# Patient Record
Sex: Male | Born: 1955 | Race: White | Hispanic: No | Marital: Married | Smoking: Never smoker
Health system: Southern US, Community
[De-identification: ages and names within clinical notes are randomized; demographics above are authoritative.]

## PROBLEM LIST (undated history)

## (undated) DIAGNOSIS — H9190 Unspecified hearing loss, unspecified ear: Secondary | ICD-10-CM

## (undated) DIAGNOSIS — K5790 Diverticulosis of intestine, part unspecified, without perforation or abscess without bleeding: Secondary | ICD-10-CM

## (undated) DIAGNOSIS — E785 Hyperlipidemia, unspecified: Secondary | ICD-10-CM

## (undated) DIAGNOSIS — G2581 Restless legs syndrome: Secondary | ICD-10-CM

## (undated) HISTORY — DX: Diverticulosis of intestine, part unspecified, without perforation or abscess without bleeding: K57.90

## (undated) HISTORY — PX: OTHER SURGICAL HISTORY: SHX169

## (undated) HISTORY — DX: Restless legs syndrome: G25.81

## (undated) HISTORY — PX: COLONOSCOPY: SHX174

## (undated) HISTORY — DX: Unspecified hearing loss, unspecified ear: H91.90

## (undated) HISTORY — PX: MENISCUS REPAIR: SHX5179

## (undated) HISTORY — DX: Hyperlipidemia, unspecified: E78.5

## (undated) HISTORY — PX: TONSILLECTOMY: SUR1361

---

## 1986-05-04 DIAGNOSIS — K5790 Diverticulosis of intestine, part unspecified, without perforation or abscess without bleeding: Secondary | ICD-10-CM

## 1986-05-04 HISTORY — DX: Diverticulosis of intestine, part unspecified, without perforation or abscess without bleeding: K57.90

## 1996-05-04 HISTORY — PX: PARTIAL COLECTOMY: SHX5273

## 1997-08-06 ENCOUNTER — Other Ambulatory Visit: Admission: RE | Admit: 1997-08-06 | Discharge: 1997-08-06 | Payer: Self-pay

## 2001-02-05 ENCOUNTER — Encounter: Payer: Self-pay | Admitting: Family Medicine

## 2001-02-05 ENCOUNTER — Encounter: Admission: RE | Admit: 2001-02-05 | Discharge: 2001-02-05 | Payer: Self-pay | Admitting: Family Medicine

## 2004-06-30 ENCOUNTER — Ambulatory Visit: Payer: Self-pay | Admitting: Family Medicine

## 2004-07-07 ENCOUNTER — Ambulatory Visit: Payer: Self-pay | Admitting: Family Medicine

## 2004-07-14 ENCOUNTER — Ambulatory Visit: Payer: Self-pay | Admitting: Internal Medicine

## 2004-07-28 ENCOUNTER — Ambulatory Visit: Payer: Self-pay | Admitting: Internal Medicine

## 2004-09-22 ENCOUNTER — Ambulatory Visit: Payer: Self-pay | Admitting: Family Medicine

## 2005-09-04 ENCOUNTER — Ambulatory Visit: Payer: Self-pay | Admitting: Family Medicine

## 2005-12-07 ENCOUNTER — Ambulatory Visit: Payer: Self-pay | Admitting: Family Medicine

## 2006-06-18 ENCOUNTER — Ambulatory Visit: Payer: Self-pay | Admitting: Family Medicine

## 2006-06-21 ENCOUNTER — Ambulatory Visit: Payer: Self-pay | Admitting: Family Medicine

## 2006-09-15 ENCOUNTER — Ambulatory Visit: Payer: Self-pay | Admitting: Internal Medicine

## 2006-09-23 ENCOUNTER — Ambulatory Visit: Payer: Self-pay | Admitting: Family Medicine

## 2006-09-28 ENCOUNTER — Ambulatory Visit: Payer: Self-pay | Admitting: Cardiology

## 2006-11-25 ENCOUNTER — Telehealth (INDEPENDENT_AMBULATORY_CARE_PROVIDER_SITE_OTHER): Payer: Self-pay | Admitting: *Deleted

## 2006-12-06 ENCOUNTER — Ambulatory Visit: Payer: Self-pay | Admitting: Family Medicine

## 2006-12-07 ENCOUNTER — Ambulatory Visit: Payer: Self-pay | Admitting: Family Medicine

## 2006-12-07 LAB — CONVERTED CEMR LAB
ALT: 26 units/L (ref 0–53)
AST: 21 units/L (ref 0–37)
Albumin: 4 g/dL (ref 3.5–5.2)
Alkaline Phosphatase: 40 units/L (ref 39–117)
BUN: 11 mg/dL (ref 6–23)
Basophils Absolute: 0 10*3/uL (ref 0.0–0.1)
Basophils Relative: 0.5 % (ref 0.0–1.0)
Bilirubin Urine: NEGATIVE
Bilirubin, Direct: 0.1 mg/dL (ref 0.0–0.3)
Blood in Urine, dipstick: NEGATIVE
CO2: 28 meq/L (ref 19–32)
Calcium: 9.2 mg/dL (ref 8.4–10.5)
Chloride: 108 meq/L (ref 96–112)
Cholesterol: 216 mg/dL (ref 0–200)
Creatinine, Ser: 0.9 mg/dL (ref 0.4–1.5)
Direct LDL: 132.9 mg/dL
Eosinophils Absolute: 0.1 10*3/uL (ref 0.0–0.6)
Eosinophils Relative: 2.3 % (ref 0.0–5.0)
GFR calc Af Amer: 114 mL/min
GFR calc non Af Amer: 94 mL/min
Glucose, Bld: 97 mg/dL (ref 70–99)
Glucose, Urine, Semiquant: NEGATIVE
HCT: 44.6 % (ref 39.0–52.0)
HDL: 50.2 mg/dL (ref >39.0)
Hemoglobin: 15.3 g/dL (ref 13.0–17.0)
Ketones, urine, test strip: NEGATIVE
Lymphocytes Relative: 29.3 % (ref 12.0–46.0)
MCHC: 34.3 g/dL (ref 30.0–36.0)
MCV: 95.1 fL (ref 78.0–100.0)
Monocytes Absolute: 0.4 10*3/uL (ref 0.2–0.7)
Monocytes Relative: 10.3 % (ref 3.0–11.0)
Neutro Abs: 2.3 10*3/uL (ref 1.4–7.7)
Neutrophils Relative %: 57.6 % (ref 43.0–77.0)
Nitrite: NEGATIVE
PSA: 0.47 ng/mL (ref 0.10–4.00)
Platelets: 195 10*3/uL (ref 150–400)
Potassium: 4.4 meq/L (ref 3.5–5.1)
Protein, U semiquant: NEGATIVE
RBC: 4.68 M/uL (ref 4.22–5.81)
RDW: 13.3 % (ref 11.5–14.6)
Sodium: 141 meq/L (ref 135–145)
Specific Gravity, Urine: 1.02
TSH: 1.44 microintl units/mL (ref 0.35–5.50)
Total Bilirubin: 1.4 mg/dL — ABNORMAL HIGH (ref 0.3–1.2)
Total CHOL/HDL Ratio: 4.3
Total Protein: 6.6 g/dL (ref 6.0–8.3)
Triglycerides: 113 mg/dL (ref 0–149)
Urobilinogen, UA: 0.2
VLDL: 23 mg/dL (ref 0–40)
WBC Urine, dipstick: NEGATIVE
WBC: 3.9 10*3/uL — ABNORMAL LOW (ref 4.5–10.5)
pH: 7

## 2006-12-13 ENCOUNTER — Encounter (INDEPENDENT_AMBULATORY_CARE_PROVIDER_SITE_OTHER): Payer: Self-pay | Admitting: *Deleted

## 2006-12-13 ENCOUNTER — Ambulatory Visit: Payer: Self-pay | Admitting: Family Medicine

## 2006-12-13 DIAGNOSIS — E7800 Pure hypercholesterolemia, unspecified: Secondary | ICD-10-CM

## 2006-12-13 DIAGNOSIS — J301 Allergic rhinitis due to pollen: Secondary | ICD-10-CM | POA: Insufficient documentation

## 2006-12-13 DIAGNOSIS — G2581 Restless legs syndrome: Secondary | ICD-10-CM | POA: Insufficient documentation

## 2007-06-04 IMAGING — CT CT HEAD W/O CM
2 series · 16 of 30 positions shown, 19 images · IV contrast (agent unspecified)
Comparison: None.

CLINICAL DATA: 50-year-old with dizziness, tinnitus.
 HEAD CT WITHOUT CONTRAST:
TECHNIQUE: Contiguous axial images were obtained from the base of the skull through the vertex according to standard protocol without contrast.

[Series 2: head_seq 4.5 h37s st · axial · 0.45mm/px · z∈[-128,+7]mm · 12 of 36 slices shown, 15 images]
[im 3/36  brain]
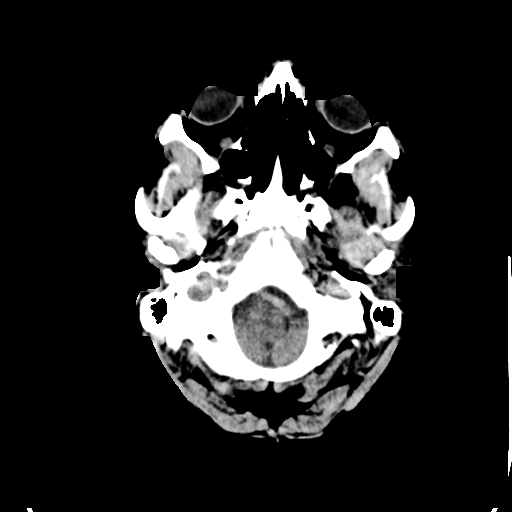
[im 3/36  bone]
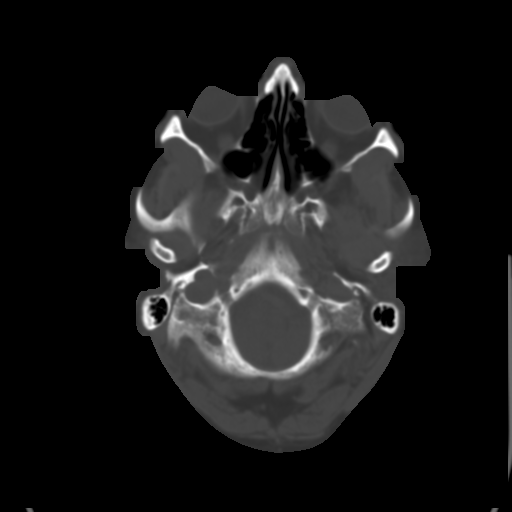
[im 6/36  brain]
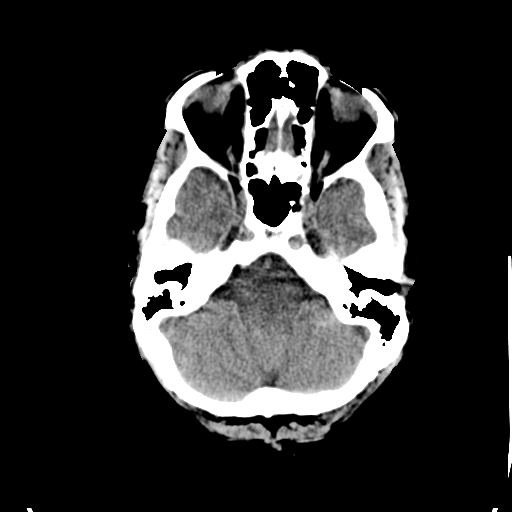
[im 9/36  brain]
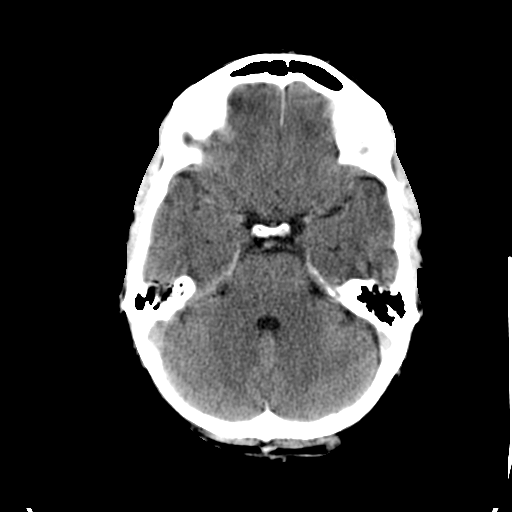
[im 11/36  brain]
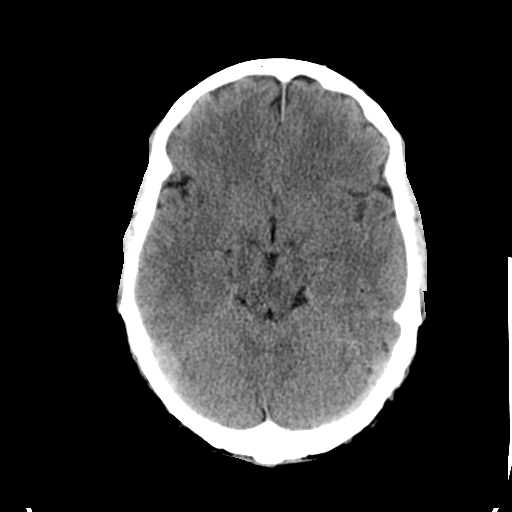
[im 14/36  brain]
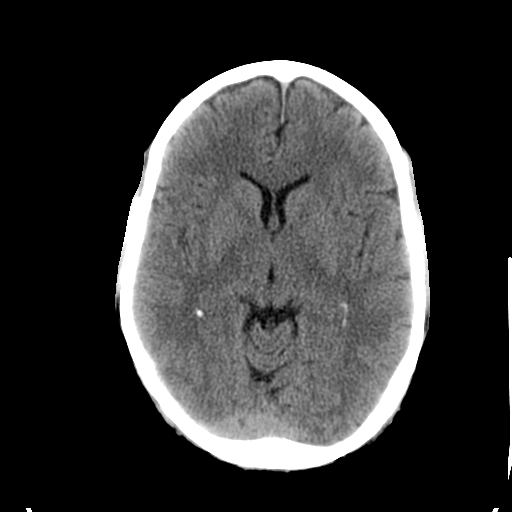
[im 14/36  bone]
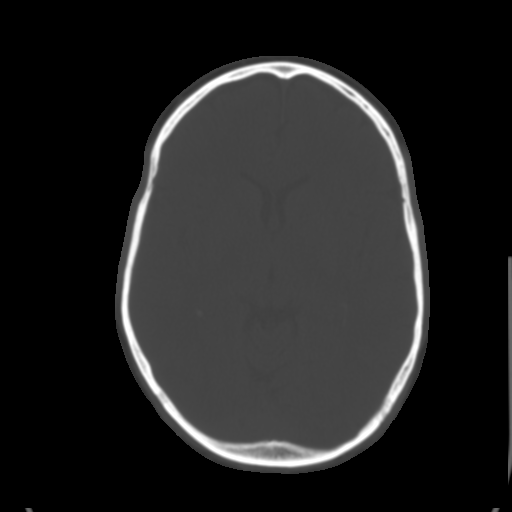
[im 17/36  brain]
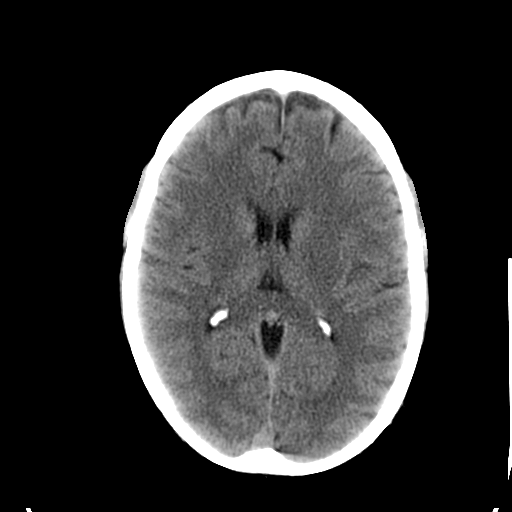
[im 19/36  brain]
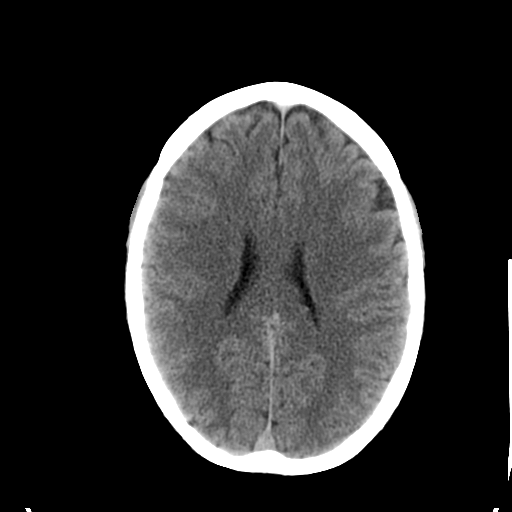
[im 22/36  brain]
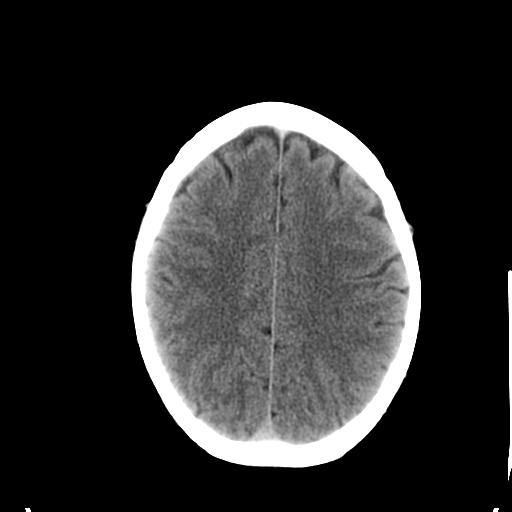
[im 25/36  brain]
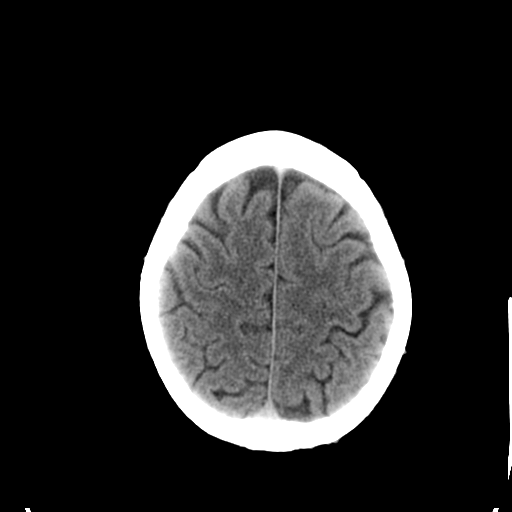
[im 25/36  bone]
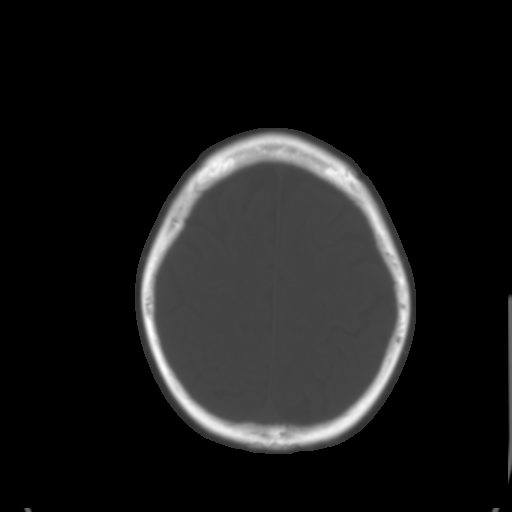
[im 27/36  brain]
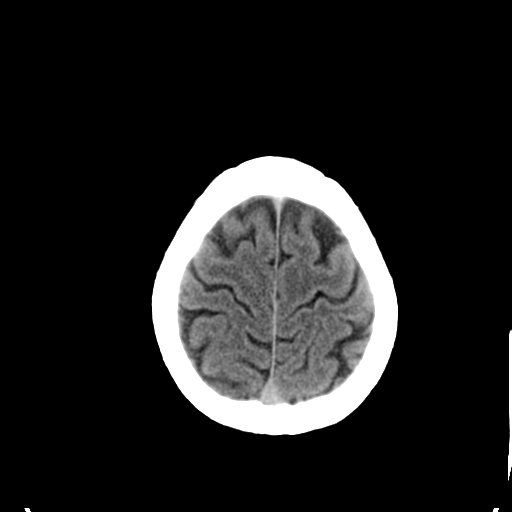
[im 30/36  brain]
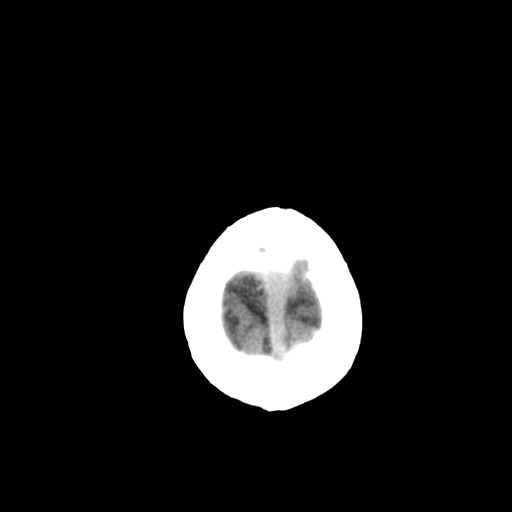
[im 33/36  brain]
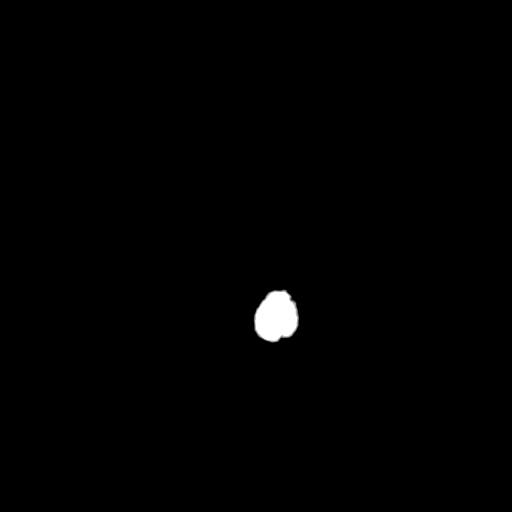

[Series 5: head_seq 1.5 h70h bone · axial · 0.30mm/px · z∈[-144,-118]mm · 4 of 42 slices shown]
[im 3/42  bone]
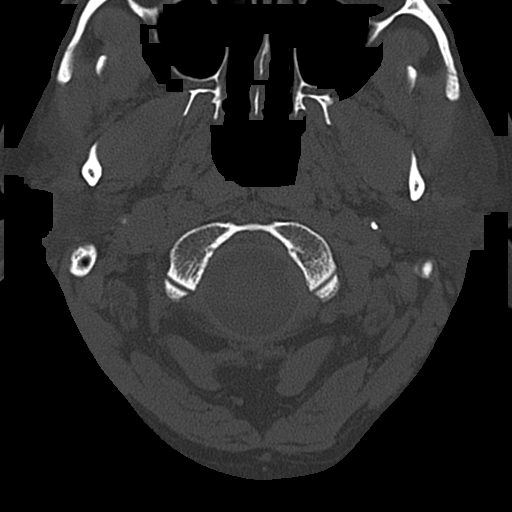
[im 9/42  bone]
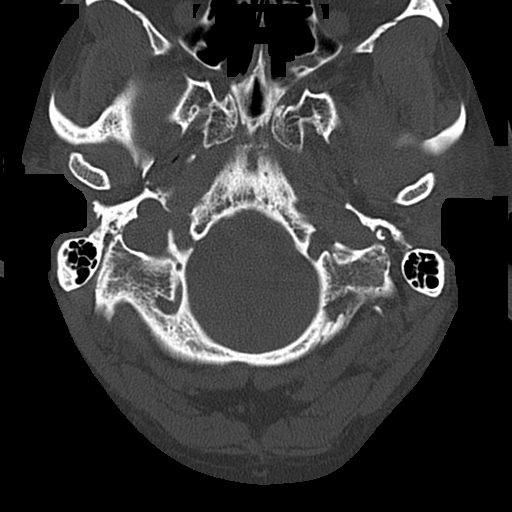
[im 14/42  bone]
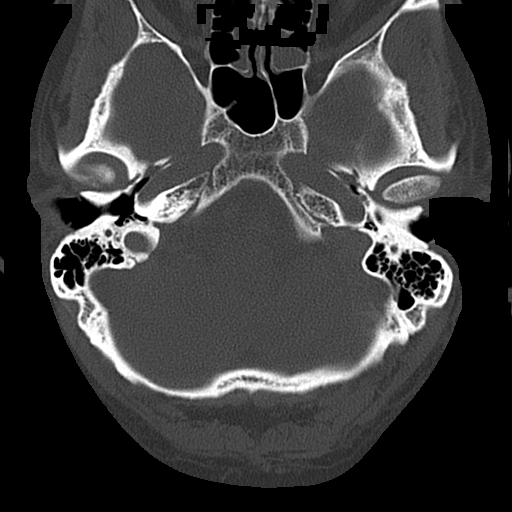
[im 20/42  bone]
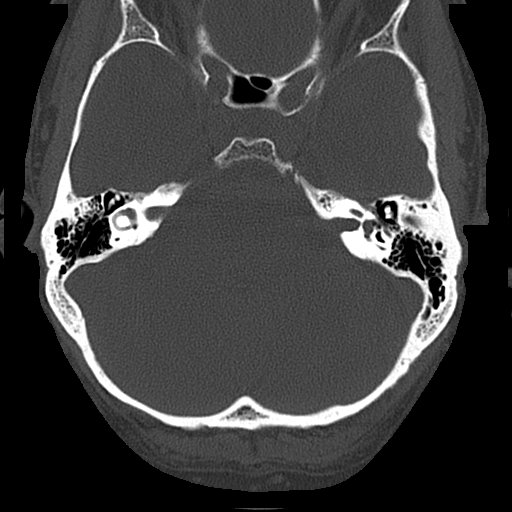

[16 of 30 positions shown; findings below may reference images not displayed]

FINDINGS: The ventricles are in the midline without mass effect or shift.  They are normal in size and configuration.  No extraaxial fluid collections are seen.  No CT evidence for acute intracranial abnormality.  No intracranial mass lesions are seen.  Brainstem and cerebellum appear normal.  The mastoid air cells and middle ear cavities are grossly normal.  Normal position and appearance of the carotid canal.  No evidence for a dehiscent jugular bulb.  The internal auditory canals appear symmetric bilaterally.  If symptoms persist, MR Zeinab be helpful in further evaluation to exclude subtle CP angle mass or acoustic schwannoma. 
 Minimal scattered ethmoid sinus disease.  The orbits appear normal.
IMPRESSION: No CT evidence for acute intracranial abnormality.  No intracranial mass lesions.  If the patient?s symptoms persist, MR Zeinab be helpful in further evaluation, as discussed above.

## 2007-06-15 ENCOUNTER — Encounter: Payer: Self-pay | Admitting: Family Medicine

## 2007-06-15 ENCOUNTER — Telehealth: Payer: Self-pay | Admitting: Family Medicine

## 2008-03-12 ENCOUNTER — Telehealth: Payer: Self-pay | Admitting: Family Medicine

## 2008-04-09 ENCOUNTER — Telehealth: Payer: Self-pay | Admitting: Family Medicine

## 2008-04-16 ENCOUNTER — Encounter: Payer: Self-pay | Admitting: Family Medicine

## 2008-04-20 ENCOUNTER — Ambulatory Visit: Payer: Self-pay | Admitting: Family Medicine

## 2008-04-20 LAB — CONVERTED CEMR LAB
ALT: 33 units/L (ref 0–53)
AST: 25 units/L (ref 0–37)
Albumin: 4.1 g/dL (ref 3.5–5.2)
Alkaline Phosphatase: 47 units/L (ref 39–117)
BUN: 13 mg/dL (ref 6–23)
Basophils Absolute: 0 10*3/uL (ref 0.0–0.1)
Basophils Relative: 0.2 % (ref 0.0–3.0)
Bilirubin Urine: NEGATIVE
Bilirubin, Direct: 0.2 mg/dL (ref 0.0–0.3)
Blood in Urine, dipstick: NEGATIVE
CO2: 30 meq/L (ref 19–32)
Calcium: 9.6 mg/dL (ref 8.4–10.5)
Chloride: 105 meq/L (ref 96–112)
Cholesterol: 231 mg/dL (ref 0–200)
Creatinine, Ser: 0.9 mg/dL (ref 0.4–1.5)
Direct LDL: 145 mg/dL
Eosinophils Absolute: 0.1 10*3/uL (ref 0.0–0.7)
Eosinophils Relative: 2.7 % (ref 0.0–5.0)
GFR calc Af Amer: 113 mL/min
GFR calc non Af Amer: 93 mL/min
Glucose, Bld: 82 mg/dL (ref 70–99)
Glucose, Urine, Semiquant: NEGATIVE
HCT: 43.7 % (ref 39.0–52.0)
HDL: 49.7 mg/dL (ref >39.0)
Hemoglobin: 15.7 g/dL (ref 13.0–17.0)
Ketones, urine, test strip: NEGATIVE
Lymphocytes Relative: 37 % (ref 12.0–46.0)
MCHC: 35.8 g/dL (ref 30.0–36.0)
MCV: 96.7 fL (ref 78.0–100.0)
Monocytes Absolute: 0.4 10*3/uL (ref 0.1–1.0)
Monocytes Relative: 10.1 % (ref 3.0–12.0)
Neutro Abs: 1.8 10*3/uL (ref 1.4–7.7)
Neutrophils Relative %: 50 % (ref 43.0–77.0)
Nitrite: NEGATIVE
PSA: 0.62 ng/mL (ref 0.10–4.00)
Platelets: 188 10*3/uL (ref 150–400)
Potassium: 3.8 meq/L (ref 3.5–5.1)
Protein, U semiquant: NEGATIVE
RBC: 4.52 M/uL (ref 4.22–5.81)
RDW: 12.5 % (ref 11.5–14.6)
Sodium: 140 meq/L (ref 135–145)
Specific Gravity, Urine: 1.015
TSH: 1.51 microintl units/mL (ref 0.35–5.50)
Total Bilirubin: 1.4 mg/dL — ABNORMAL HIGH (ref 0.3–1.2)
Total CHOL/HDL Ratio: 4.6
Total Protein: 6.7 g/dL (ref 6.0–8.3)
Triglycerides: 127 mg/dL (ref 0–149)
Urobilinogen, UA: 0.2
VLDL: 25 mg/dL (ref 0–40)
WBC Urine, dipstick: NEGATIVE
WBC: 3.7 10*3/uL — ABNORMAL LOW (ref 4.5–10.5)
pH: 7.5

## 2008-04-26 ENCOUNTER — Ambulatory Visit: Payer: Self-pay | Admitting: Family Medicine

## 2008-04-26 DIAGNOSIS — E785 Hyperlipidemia, unspecified: Secondary | ICD-10-CM

## 2008-04-26 DIAGNOSIS — R0989 Other specified symptoms and signs involving the circulatory and respiratory systems: Secondary | ICD-10-CM | POA: Insufficient documentation

## 2008-04-26 DIAGNOSIS — J309 Allergic rhinitis, unspecified: Secondary | ICD-10-CM | POA: Insufficient documentation

## 2008-04-26 DIAGNOSIS — R0609 Other forms of dyspnea: Secondary | ICD-10-CM

## 2008-05-08 ENCOUNTER — Ambulatory Visit: Payer: Self-pay | Admitting: Internal Medicine

## 2008-05-08 ENCOUNTER — Encounter: Payer: Self-pay | Admitting: Family Medicine

## 2008-10-15 ENCOUNTER — Ambulatory Visit: Payer: Self-pay | Admitting: Family Medicine

## 2008-10-15 DIAGNOSIS — H8110 Benign paroxysmal vertigo, unspecified ear: Secondary | ICD-10-CM

## 2008-10-15 DIAGNOSIS — L0203 Carbuncle of face: Secondary | ICD-10-CM

## 2008-10-15 DIAGNOSIS — L0202 Furuncle of face: Secondary | ICD-10-CM

## 2008-10-15 LAB — CONVERTED CEMR LAB
ALT: 29 units/L (ref 0–53)
AST: 22 units/L (ref 0–37)
Albumin: 4.1 g/dL (ref 3.5–5.2)
Alkaline Phosphatase: 49 units/L (ref 39–117)
Bilirubin, Direct: 0.1 mg/dL (ref 0.0–0.3)
Cholesterol: 193 mg/dL (ref 0–200)
HDL: 46.2 mg/dL (ref >39.00)
LDL Cholesterol: 117 mg/dL — ABNORMAL HIGH (ref 0–99)
Total Bilirubin: 1 mg/dL (ref 0.3–1.2)
Total CHOL/HDL Ratio: 4
Total Protein: 6.9 g/dL (ref 6.0–8.3)
Triglycerides: 147 mg/dL (ref 0.0–149.0)
VLDL: 29.4 mg/dL (ref 0.0–40.0)

## 2008-10-31 ENCOUNTER — Telehealth: Payer: Self-pay | Admitting: *Deleted

## 2009-04-22 ENCOUNTER — Ambulatory Visit: Payer: Self-pay | Admitting: Family Medicine

## 2009-04-22 LAB — CONVERTED CEMR LAB
ALT: 36 units/L (ref 0–53)
AST: 26 units/L (ref 0–37)
Albumin: 4.2 g/dL (ref 3.5–5.2)
Alkaline Phosphatase: 51 units/L (ref 39–117)
BUN: 13 mg/dL (ref 6–23)
Basophils Absolute: 0 10*3/uL (ref 0.0–0.1)
Basophils Relative: 0.4 % (ref 0.0–3.0)
Bilirubin Urine: NEGATIVE
Bilirubin, Direct: 0 mg/dL (ref 0.0–0.3)
Blood in Urine, dipstick: NEGATIVE
CO2: 30 meq/L (ref 19–32)
Calcium: 9.2 mg/dL (ref 8.4–10.5)
Chloride: 106 meq/L (ref 96–112)
Cholesterol: 202 mg/dL — ABNORMAL HIGH (ref 0–200)
Creatinine, Ser: 0.9 mg/dL (ref 0.4–1.5)
Direct LDL: 123.4 mg/dL
Eosinophils Absolute: 0.1 10*3/uL (ref 0.0–0.7)
Eosinophils Relative: 2.8 % (ref 0.0–5.0)
GFR calc non Af Amer: 92.99 mL/min (ref >60)
Glucose, Bld: 104 mg/dL — ABNORMAL HIGH (ref 70–99)
Glucose, Urine, Semiquant: NEGATIVE
HCT: 47.7 % (ref 39.0–52.0)
HDL: 43.9 mg/dL (ref >39.00)
Hemoglobin: 16.1 g/dL (ref 13.0–17.0)
Ketones, urine, test strip: NEGATIVE
Lymphocytes Relative: 33.2 % (ref 12.0–46.0)
Lymphs Abs: 1.5 10*3/uL (ref 0.7–4.0)
MCHC: 33.7 g/dL (ref 30.0–36.0)
MCV: 99.5 fL (ref 78.0–100.0)
Monocytes Absolute: 0.4 10*3/uL (ref 0.1–1.0)
Monocytes Relative: 9.9 % (ref 3.0–12.0)
Neutro Abs: 2.4 10*3/uL (ref 1.4–7.7)
Neutrophils Relative %: 53.7 % (ref 43.0–77.0)
Nitrite: NEGATIVE
PSA: 0.6 ng/mL (ref 0.10–4.00)
Platelets: 195 10*3/uL (ref 150.0–400.0)
Potassium: 4.4 meq/L (ref 3.5–5.1)
RBC: 4.8 M/uL (ref 4.22–5.81)
RDW: 12.3 % (ref 11.5–14.6)
Sodium: 143 meq/L (ref 135–145)
Specific Gravity, Urine: 1.02
TSH: 1.87 microintl units/mL (ref 0.35–5.50)
Total Bilirubin: 1 mg/dL (ref 0.3–1.2)
Total CHOL/HDL Ratio: 5
Total Protein: 7.4 g/dL (ref 6.0–8.3)
Triglycerides: 108 mg/dL (ref 0.0–149.0)
Urobilinogen, UA: 1
VLDL: 21.6 mg/dL (ref 0.0–40.0)
WBC Urine, dipstick: NEGATIVE
WBC: 4.4 10*3/uL — ABNORMAL LOW (ref 4.5–10.5)
pH: 6.5

## 2009-05-07 ENCOUNTER — Ambulatory Visit: Payer: Self-pay | Admitting: Family Medicine

## 2009-05-08 ENCOUNTER — Encounter: Payer: Self-pay | Admitting: Family Medicine

## 2009-05-09 ENCOUNTER — Telehealth: Payer: Self-pay | Admitting: Family Medicine

## 2009-05-15 ENCOUNTER — Telehealth: Payer: Self-pay | Admitting: Family Medicine

## 2010-02-10 ENCOUNTER — Telehealth: Payer: Self-pay | Admitting: Family Medicine

## 2010-04-30 ENCOUNTER — Ambulatory Visit: Payer: Self-pay | Admitting: Family Medicine

## 2010-04-30 LAB — CONVERTED CEMR LAB
ALT: 25 units/L (ref 0–53)
AST: 20 units/L (ref 0–37)
BUN: 16 mg/dL (ref 6–23)
Bilirubin Urine: NEGATIVE
Bilirubin, Direct: 0.1 mg/dL (ref 0.0–0.3)
Blood in Urine, dipstick: NEGATIVE
Cholesterol: 217 mg/dL — ABNORMAL HIGH (ref 0–200)
Creatinine, Ser: 0.9 mg/dL (ref 0.4–1.5)
Eosinophils Relative: 2.3 % (ref 0.0–5.0)
GFR calc non Af Amer: 88.11 mL/min (ref >60.00)
Glucose, Urine, Semiquant: NEGATIVE
HDL: 56.5 mg/dL (ref >39.00)
Lymphocytes Relative: 34 % (ref 12.0–46.0)
Monocytes Relative: 8.6 % (ref 3.0–12.0)
Neutrophils Relative %: 54.4 % (ref 43.0–77.0)
PSA: 0.56 ng/mL (ref 0.10–4.00)
Platelets: 206 10*3/uL (ref 150.0–400.0)
Total Bilirubin: 0.6 mg/dL (ref 0.3–1.2)
Urobilinogen, UA: 0.2
VLDL: 16.8 mg/dL (ref 0.0–40.0)
WBC Urine, dipstick: NEGATIVE
WBC: 4.5 10*3/uL (ref 4.5–10.5)
pH: 8.5

## 2010-05-12 ENCOUNTER — Encounter: Payer: Self-pay | Admitting: Family Medicine

## 2010-05-12 ENCOUNTER — Ambulatory Visit
Admission: RE | Admit: 2010-05-12 | Discharge: 2010-05-12 | Payer: Self-pay | Source: Home / Self Care | Attending: Family Medicine | Admitting: Family Medicine

## 2010-05-12 DIAGNOSIS — B078 Other viral warts: Secondary | ICD-10-CM | POA: Insufficient documentation

## 2010-06-03 NOTE — Progress Notes (Signed)
Summary: rx sent to [facility]  Phone Note Refill Request   Refills Requested: Medication #1:  NASONEX 50 MCG/ACT  SUSP PRN  Medication #2:  ZOCOR 20 MG TABS 1 tab @ bedtime  Medication #3:  REQUIP 2 MG TABS 1 tab @ bedtime  Medication #4:  VIAGRA 50 MG TABS UAD. Initial call taken by: Kern Reap CMA Duncan Dull),  May 09, 2009 2:33 PM    Prescriptions: VIAGRA 50 MG TABS (SILDENAFIL CITRATE) UAD  #6 x 11   Entered by:   Kern Reap CMA (AAMA)   Authorized by:   Roderick Pee MD   Signed by:   Kern Reap CMA (AAMA) on 05/09/2009   Method used:   Electronically to        [facility]* (mail-order)       [address] [place], [place]  [address]       Ph: [phone]       Fax: [phone]   RxID:   [id number] REQUIP 2 MG TABS (ROPINIROLE HCL) 1 tab @ bedtime  #100 x 3   Entered by:   Kern Reap CMA (AAMA)   Authorized by:   Roderick Pee MD   Signed by:   Kern Reap CMA (AAMA) on 05/09/2009   Method used:   Electronically to        [facility]* (mail-order)       [address] [place], [place]  [address]       Ph: [phone]       Fax: [phone]   RxID:   [id number] ZOCOR 20 MG TABS (SIMVASTATIN) 1 tab @ bedtime  #100 x 3   Entered by:   Kern Reap CMA (AAMA)   Authorized by:   Roderick Pee MD   Signed by:   Kern Reap CMA (AAMA) on 05/09/2009   Method used:   Electronically to        [facility]* (mail-order)       [address] [place], [place]  [address]       Ph: [phone]       Fax: [phone]   RxID:   [id number] NASONEX 50 MCG/ACT  SUSP (MOMETASONE FUROATE) PRN  #3 x 3   Entered by:   Kern Reap CMA (AAMA)   Authorized by:   Roderick Pee MD   Signed by:   Kern Reap CMA (AAMA) on 05/09/2009   Method used:   Electronically to        [facility]* (mail-order)       [address] [place], [place]  [address]       Ph:  [phone]       Fax: [phone]   RxID:   [id number]

## 2010-06-03 NOTE — Progress Notes (Signed)
Summary: Refill Request  Phone Note Refill Request Message from:  Patient on February 10, 2010 9:06 AM  Refills Requested: Medication #1:  REQUIP 2 MG TABS 1 tab @ bedtime   Dosage confirmed as above?Dosage Confirmed   Brand Name Necessary? No   Supply Requested: 3 months   [facility] [address]  Next Appointment Scheduled: none Initial call taken by: Harold Barban,  February 10, 2010 9:06 AM  Follow-up for Phone Call        requip 2 mg, dispense 100 directions one nightly for RLS refills x 2 Follow-up by: Roderick Pee MD,  February 10, 2010 10:25 AM    Prescriptions: REQUIP 2 MG TABS (ROPINIROLE HCL) 1 tab @ bedtime  #100 x 3   Entered by:   Lynann Beaver CMA   Authorized by:   Roderick Pee MD   Signed by:   Lynann Beaver CMA on 02/10/2010   Method used:   [facility]  [place] [address] [facility]       [address]       [place], [place]  [address]       Ph: [phone] or [phone]       Fax: [phone]   RxID:   [id number]

## 2010-06-03 NOTE — Letter (Signed)
Summary: Health Examination Report  Health Examination Report   Imported By: Maryln Gottron 05/14/2009 12:40:03  _____________________________________________________________________  External Attachment:    Type:   Image     Comment:   External Document

## 2010-06-03 NOTE — Assessment & Plan Note (Signed)
Summary: cpx/cjr   Vital Signs:  Patient profile:   55 year old male Height:      68 inches Weight:      193 pounds BMI:     29.45 O2 Sat:      96 % on Room air Temp:     98.5 degrees F oral Pulse rate:   90 / minute BP sitting:   136 / 70  (left arm)  Vitals Entered By: Lucious Groves (May 07, 2009 9:45 AM)  O2 Flow:  Room air  History of Present Illness: Casey Kim is a 55 year old, married male, nonsmoker, who comes in today for evaluation of a few issues.  He has restless leg syndrome.  His son.  Her quick to 1 mg daily, but is not working. we will increase the dose.  He has a history of hyperlipidemia.  He takes Zocor 20 mg daily.  Lipids are at goal with an LDL cholesterol of 123, HDL 44.  He would like his skin checked.  He has a lesion on the left side of his face and one and the front of his right ear in the hairline.  In addition, he has slight skin and light eyes.  He is continued to be troubled with the tinnitus.  He seen an ENT doctor.  Evaluation was negative.  The next step is  a MRI to rule out a tumor.  He will pursue this with ENT.  He has a bump on his penis.  He would like treated.  He gets routine eye care and dental care.  Colonoscopy done in GI.  Three years ago.  He had a partial colectomy because of severe diverticulitis.  Tetanus booster 2005 seasonal flu shot today.  He and his wife now are both empty nesrwes   He states his sex life is outstanding                 Current Medications (verified): 1)  Requip 1 Mg  Tabs (Ropinirole Hcl) .Marland Kitchen.. 1 Tab At Bedtime 2)  Asa 81 3)  Nasonex 50 Mcg/act  Susp (Mometasone Furoate) .... Prn 4)  Zocor 20 Mg Tabs (Simvastatin) .Marland Kitchen.. 1 Tab @ Bedtime  Allergies (verified): 1)  ! * Fish Oil  Past History:  Past medical, surgical, family and social histories (including risk factors) reviewed, and no changes noted (except as noted below).  Past Medical History: Reviewed history from 04/26/2008 and no  changes required. Allergic rhinitis Hyperlipidemia restless leg syndrome  Past Surgical History: partial colectomy 2007 for severe diverticulitis  Family History: Reviewed history from 10/15/2008 and no changes required. Family History High cholesterol  Social History: Reviewed history from 04/26/2008 and no changes required. Occupation: Married Never Smoked Alcohol use-no Drug use-no Regular exercise-yes  Review of Systems      See HPI  Physical Exam  General:  Well-developed,well-nourished,in no acute distress; alert,appropriate and cooperative throughout examination Head:  Normocephalic and atraumatic without obvious abnormalities. No apparent alopecia or balding. Eyes:  No corneal or conjunctival inflammation noted. EOMI. Perrla. Funduscopic exam benign, without hemorrhages, exudates or papilledema. Vision grossly normal. Ears:  External ear exam shows no significant lesions or deformities.  Otoscopic examination reveals clear canals, tympanic membranes are intact bilaterally without bulging, retraction, inflammation or discharge. Hearing is grossly normal bilaterally. Nose:  External nasal examination shows no deformity or inflammation. Nasal mucosa are pink and moist without lesions or exudates. Mouth:  Oral mucosa and oropharynx without lesions or exudates.  Teeth in good  repair. Neck:  No deformities, masses, or tenderness noted. Chest Wall:  No deformities, masses, tenderness or gynecomastia noted. Breasts:  No masses or gynecomastia noted Lungs:  Normal respiratory effort, chest expands symmetrically. Lungs are clear to auscultation, no crackles or wheezes. Heart:  Normal rate and regular rhythm. S1 and S2 normal without gallop, murmur, click, rub or other extra sounds. Abdomen:  Bowel sounds positive,abdomen soft and non-tender without masses, organomegaly or hernias noted. Rectal:  No external abnormalities noted. Normal sphincter tone. No rectal masses or  tenderness. Genitalia:  Testes bilaterally descended without nodularity, tenderness or masses. No scrotal masses or lesions. No penis lesions or urethral discharge. Prostate:  Prostate gland firm and smooth, no enlargement, nodularity, tenderness, mass, asymmetry or induration. Msk:  No deformity or scoliosis noted of thoracic or lumbar spine.   Pulses:  R and L carotid,radial,femoral,dorsalis pedis and posterior tibial pulses are full and equal bilaterally Extremities:  No clubbing, cyanosis, edema, or deformity noted with normal full range of motion of all joints.   Neurologic:  No cranial nerve deficits noted. Station and gait are normal. Plantar reflexes are down-going bilaterally. DTRs are symmetrical throughout. Sensory, motor and coordinative functions appear intact. Skin:  total body skin exam normal.  A light red lesion on his left for head that was a bump that he scraped.  Also, the lesion in his right hairline is a mole.  The margins are smooth, etc. Cervical Nodes:  No lymphadenopathy noted Axillary Nodes:  No palpable lymphadenopathy Inguinal Nodes:  No significant adenopathy Psych:  Cognition and judgment appear intact. Alert and cooperative with normal attention span and concentration. No apparent delusions, illusions, hallucinations   Impression & Recommendations:  Problem # 1:  HYPERLIPIDEMIA (ICD-272.4) Assessment Improved  His updated medication list for this problem includes:    Zocor 20 Mg Tabs (Simvastatin) .Marland Kitchen... 1 tab @ bedtime  Problem # 2:  RESTLESS LEG SYNDROME (ICD-333.94) Assessment: Deteriorated  Problem # 3:  PREVENTIVE HEALTH CARE (ICD-V70.0) Assessment: Unchanged  Orders: EKG w/ Interpretation ([address])  Complete Medication List: 1)  Asa 81  2)  Nasonex 50 Mcg/act Susp (Mometasone furoate) .... Prn 3)  Zocor 20 Mg Tabs (Simvastatin) .Marland Kitchen.. 1 tab @ bedtime 4)  Requip 2 Mg Tabs (Ropinirole hcl) .Marland Kitchen.. 1 tab @ bedtime 5)  Viagra 50 Mg Tabs (Sildenafil  citrate) .... Uad  Other Orders: Admin 1st Vaccine ([address]) Flu Vaccine 64yrs + ([address])  Patient Instructions: 1)  Please schedule a follow-up appointment in 1 year. 2)  It is important that you exercise regularly at least 20 minutes 5 times a week. If you develop chest pain, have severe difficulty breathing, or feel very tired , stop exercising immediately and seek medical attention. 3)  Schedule a colonoscopy/sigmoidoscopy to help detect colon cancer. 4)  Take an Aspirin every day. Prescriptions: VIAGRA 50 MG TABS (SILDENAFIL CITRATE) UAD  #6 x 11   Entered and Authorized by:   Roderick Pee MD   Signed by:   Roderick Pee MD on 05/07/2009   Method used:   [facility]  [place] [address] [facility]       [address]       [place], [place]  [address]       Ph: [phone] or [phone]       Fax: [phone]   RxID:   [id number] ZOCOR 20 MG TABS (SIMVASTATIN) 1 tab @ bedtime  #100 x  3   Entered and Authorized by:   Roderick Pee MD   Signed by:   Roderick Pee MD on 05/07/2009   Method used:   [facility]        [facility]  [place] [address] [facility]       [address]       [place], [place]  [address]       Ph: [phone] or [phone]       Fax: [phone]   RxID:   [id number] NASONEX 50 MCG/ACT  SUSP (MOMETASONE FUROATE) PRN  #3 x 3   Entered and Authorized by:   Roderick Pee MD   Signed by:   Roderick Pee MD on 05/07/2009   Method used:   [facility]        [facility]  [place] [address]       [address]       [place], [place]  [address]       Ph: [phone] or [phone]       Fax: [phone]   RxID:   [id number] REQUIP 2 MG TABS (ROPINIROLE HCL) 1 tab @ bedtime  #100 x 3   Entered and Authorized by:   Roderick Pee MD   Signed by:   Roderick Pee MD on 05/07/2009   Method used:   [facility]        [facility]  [place] [address] [facility]       [address]       [place], [place]  [address]       Ph:  [phone] or [phone]       Fax: [phone]   RxID:   [id number]   Preventive Care Screening  Colonoscopy:    Date:  05/04/2005    Results:  abnormal       Flu Vaccine Consent Questions     Do you have a history of severe allergic reactions to this vaccine? no    Any prior history of allergic reactions to egg and/or gelatin? no    Do you have a sensitivity to the preservative Thimersol? no    Do you have a past history of Guillan-Barre Syndrome? no    Do you currently have an acute febrile illness? no    Have you ever had a severe reaction to latex? no    Vaccine information given and explained to patient? yes    Are you currently pregnant? no    Lot Number:[id number]   Exp Date:10/31/2009   Site Given  Left Deltoid IMbflu Lucious Groves, CMA

## 2010-06-03 NOTE — Progress Notes (Signed)
Summary: form completed?  Phone Note Call from Patient Call back at cell [phone]   Caller: Patient, via vm Call For: Roderick Pee MD Summary of Call: Left form last week.  Needs it now ASAP.  Please call when ready. Initial call taken by: Gladis Riffle, RN,  May 15, 2009 12:42 PM  Follow-up for Phone Call        I copied the forearm and had the patient called to come pickup form Follow-up by: Roderick Pee MD,  May 15, 2009 2:04 PM

## 2010-06-05 NOTE — Assessment & Plan Note (Signed)
Summary: cpx/cjr   Vital Signs:  Patient profile:   55 year old male Height:      68 inches Weight:      188 pounds BMI:     28.69 Temp:     98.2 degrees F oral BP sitting:   130 / 90  (left arm) Cuff size:   regular  Vitals Entered By: Kern Reap CMA Duncan Dull) (May 12, 2010 11:35 AM) CC: cpx Is Patient Diabetic? No   CC:  cpx.  History of Present Illness: Casey Kim is a 55 year old, married male, nonsmoker, who comes in today for general physical examination because of allergic rhinitis, hyperlipidemia, restless leg syndrome, mild ED, and a wart on his penis in his rectum he would like treated.  Also in the past two, years he's noticed a slight tremor is worse in his left thumb.  We discussed the tremor and the RLS and he would like a consult from a neurologist.  I recommend Dr. Porfirio Mylar D..  Review of systems otherwise negative.  He gets routine eye care, dental care, colonoscopy 2007 normal, tetanus, 2005, seasonal flu shot 2011.  Allergies: 1)  ! * Fish Oil  Past History:  Past medical, surgical, family and social histories (including risk factors) reviewed, and no changes noted (except as noted below).  Past Medical History: Reviewed history from 04/26/2008 and no changes required. Allergic rhinitis Hyperlipidemia restless leg syndrome  Past Surgical History: Reviewed history from 05/07/2009 and no changes required. partial colectomy 2007 for severe diverticulitis  Family History: Reviewed history from 10/15/2008 and no changes required. Family History High cholesterol  Social History: Reviewed history from 04/26/2008 and no changes required. Occupation: Married Never Smoked Alcohol use-no Drug use-no Regular exercise-yes  Review of Systems      See HPI  Physical Exam  General:  Well-developed,well-nourished,in no acute distress; alert,appropriate and cooperative throughout examination Head:  Normocephalic and atraumatic without obvious  abnormalities. No apparent alopecia or balding. Eyes:  No corneal or conjunctival inflammation noted. EOMI. Perrla. Funduscopic exam benign, without hemorrhages, exudates or papilledema. Vision grossly normal. Ears:  External ear exam shows no significant lesions or deformities.  Otoscopic examination reveals clear canals, tympanic membranes are intact bilaterally without bulging, retraction, inflammation or discharge. Hearing is grossly normal bilaterally. Nose:  External nasal examination shows no deformity or inflammation. Nasal mucosa are pink and moist without lesions or exudates. Mouth:  Oral mucosa and oropharynx without lesions or exudates.  Teeth in good repair. Neck:  No deformities, masses, or tenderness noted. Chest Wall:  No deformities, masses, tenderness or gynecomastia noted. Breasts:  No masses or gynecomastia noted Lungs:  Normal respiratory effort, chest expands symmetrically. Lungs are clear to auscultation, no crackles or wheezes. Heart:  Normal rate and regular rhythm. S1 and S2 normal without gallop, murmur, click, rub or other extra sounds. Abdomen:  Bowel sounds positive,abdomen soft and non-tender without masses, organomegaly or hernias noted. Rectal:  No external abnormalities noted. Normal sphincter tone. No rectal masses or tenderness. Genitalia:  Testes bilaterally descended without nodularity, tenderness or masses. No scrotal masses or lesions. No penis lesions or urethral discharge. Prostate:  Prostate gland firm and smooth, no enlargement, nodularity, tenderness, mass, asymmetry or induration. Msk:  No deformity or scoliosis noted of thoracic or lumbar spine.   Pulses:  R and L carotid,radial,femoral,dorsalis pedis and posterior tibial pulses are full and equal bilaterally Extremities:  No clubbing, cyanosis, edema, or deformity noted with normal full range of motion of all joints.  Neurologic:  No cranial nerve deficits noted. Station and gait are normal. Plantar  reflexes are down-going bilaterally. DTRs are symmetrical throughout. 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  That was removed by the dermatologist.  Also, a wart on the scrotum and on the rectum were treated with 80% TCA Cervical Nodes:  No lymphadenopathy noted Axillary Nodes:  No palpable lymphadenopathy Inguinal Nodes:  No significant adenopathy Psych:  Cognition and judgment appear intact. Alert and cooperative with normal attention span and concentration. No apparent delusions, illusions, hallucinations   Impression & Recommendations:  Problem # 1:  HYPERLIPIDEMIA (ICD-272.4) Assessment Improved  His updated medication list for this problem includes:    Zocor 20 Mg Tabs (Simvastatin) .Marland Kitchen... 1 tab @ bedtime  Orders: Prescription Created Electronically [phone])  Problem # 2:  ALLERGIC RHINITIS (ICD-477.9) Assessment: Improved  His updated medication list for this problem includes:    Nasonex 50 Mcg/act Susp (Mometasone furoate) .Marland Kitchen... Prn  Orders: Prescription Created Electronically [phone])  Problem # 3:  RESTLESS LEG SYNDROME (ICD-333.94) Assessment: Deteriorated  Orders: Prescription Created Electronically [phone])  Problem # 4:  PREVENTIVE HEALTH CARE (ICD-V70.0) Assessment: Unchanged  Orders: Prescription Created Electronically [phone])  Complete Medication List: 1)  Asa 81  2)  Nasonex 50 Mcg/act Susp (Mometasone furoate) .... Prn 3)  Zocor 20 Mg Tabs (Simvastatin) .Marland Kitchen.. 1 tab @ bedtime 4)  Requip 2 Mg Tabs (Ropinirole hcl) .Marland Kitchen.. 1 tab @ bedtime 5)  Viagra 50 Mg Tabs (Sildenafil citrate) .... Uad  Other Orders: Cryotherapy/Destruction benign or premalignant lesion (2nd-14th lesions) ([address])  Patient Instructions: 1)  if you need any jewelry for her daughter's wedding.  Call [facility] [phone] 2)  we  will call  [facility] and request a consult from Dr. Porfirio Mylar D.//////////// neurologist for evaluation of the tremor and the restless leg syndrome 3)  Please schedule a follow-up appointment in 1 year. 4)  Please schedule a follow-up appointment as needed. Prescriptions: VIAGRA 50 MG TABS (SILDENAFIL CITRATE) UAD  #6 x 11   Entered and Authorized by:   Roderick Pee MD   Signed by:   Roderick Pee MD on 05/12/2010   Method used:   [facility]        [facility]  [place] [address] [facility]       [address]       [place], [place]  [address]       Ph: [phone] or [phone]       Fax: [phone]   RxID:   [id number] REQUIP 2 MG TABS (ROPINIROLE HCL) 1 tab @ bedtime  #100 x 3   Entered and Authorized by:   Roderick Pee MD   Signed by:   Roderick Pee MD on 05/12/2010   Method used:   [facility]        [facility]  [place] [address] [facility]       [address]       [place], [place]  [address]       Ph: [phone] or [phone]       Fax: [phone]   RxID:   [id number] ZOCOR 20 MG TABS (SIMVASTATIN) 1 tab @ bedtime  #100 x 3   Entered and Authorized by:   Roderick Pee MD   Signed by:  Roderick Pee MD on 05/12/2010   Method used:   [facility]        [facility]  [place] [address] [facility]       [address]       [place], [place]  [address]       Ph: [phone] or [phone]       Fax: [phone]   RxID:   [id number] NASONEX 50 MCG/ACT  SUSP (MOMETASONE FUROATE) PRN  #3 x 3   Entered and Authorized by:   Roderick Pee MD   Signed by:   Roderick Pee MD on 05/12/2010   Method used:   [facility]        [facility]  [place] [address]       [address]       [place], [place]  [address]       Ph: [phone] or [phone]       Fax: [phone]   RxID:   [id number]    Orders Added: 1)  Prescription Created Electronically [G8553] 2)  Est. Patient 40-64 years [[address]] 3)  Cryotherapy/Destruction benign or premalignant  lesion (2nd-14th lesions) [[address]]       Appended Document: Orders Update     Clinical Lists Changes  Orders: Added new Service order of EKG w/ Interpretation ([address]) - Signed

## 2010-06-30 ENCOUNTER — Telehealth: Payer: Self-pay | Admitting: Family Medicine

## 2010-06-30 DIAGNOSIS — G2581 Restless legs syndrome: Secondary | ICD-10-CM

## 2010-06-30 MED ORDER — ROPINIROLE HCL ER 2 MG PO TB24: 2.0000 mg | ORAL_TABLET | Freq: Every day | ORAL | Status: DC

## 2010-06-30 MED ORDER — MOMETASONE FUROATE 50 MCG/ACT NA SUSP: 2.0000 | Freq: Every day | NASAL | Status: AC

## 2010-06-30 NOTE — Telephone Encounter (Signed)
Pt called to check on stauts of refill on meds:  Rapineral, 10mg   And  Nasonex..... [facility] [place].

## 2010-08-26 ENCOUNTER — Telehealth: Payer: Self-pay | Admitting: *Deleted

## 2010-08-26 MED ORDER — ROPINIROLE HCL 2 MG PO TABS: 2.0000 mg | ORAL_TABLET | Freq: Every day | ORAL | Status: DC

## 2010-08-26 NOTE — Telephone Encounter (Signed)
yes

## 2010-08-26 NOTE — Telephone Encounter (Signed)
There is no generic Requip xl is it okay to switch back to regular Requip?

## 2010-08-26 NOTE — Telephone Encounter (Signed)
Pt would like to speak with Fleet Contras in regards to switching from Requip XL to regular Requip.

## 2010-10-27 ENCOUNTER — Other Ambulatory Visit: Payer: Self-pay | Admitting: Dermatology

## 2011-02-09 ENCOUNTER — Encounter: Payer: Self-pay | Admitting: Family Medicine

## 2011-02-09 ENCOUNTER — Ambulatory Visit (INDEPENDENT_AMBULATORY_CARE_PROVIDER_SITE_OTHER): Payer: 59 | Admitting: Family Medicine

## 2011-02-09 VITALS — BP 120/86 | Temp 98.4°F | Wt 195.0 lb

## 2011-02-09 DIAGNOSIS — H019 Unspecified inflammation of eyelid: Secondary | ICD-10-CM

## 2011-02-09 DIAGNOSIS — H00019 Hordeolum externum unspecified eye, unspecified eyelid: Secondary | ICD-10-CM

## 2011-02-09 MED ORDER — CEPHALEXIN 500 MG PO CAPS: ORAL_CAPSULE | ORAL | Status: AC

## 2011-02-09 NOTE — Progress Notes (Signed)
  Subjective:    Patient ID: Casey Kim, male    DOB: 1956-03-26, 55 y.o.   MRN: [id number]  HPI Casey Kim is a 55 year old, married male, nonsmoker........... Now employed again, after 11 F. Months of unemployment........ Comes in today for evaluation of an infection.  The lower left eyelid x 5 days.  He states today the redness and swelling has diminished, but not gone.  Vision normal.  No history of trauma   Review of Systems General an ophthalmologic review of systems otherwise negative   Objective:   Physical Exam  Well-developed well-nourished, male in no acute distress.  Examination of the right eye shows the upper lid to be normal.  There is some inflammation, redness of the lower lashes.  No palpable mass or abscess      Assessment & Plan:  Infection of the lower left eyelashes, left eye.  Plan warm soaks Keflex 1 g b.i.d. Consult with the ophthalmologist p.r.n.

## 2011-02-09 NOTE — Patient Instructions (Signed)
Warm soaks 3 times daily.  Keflex two tabs twice daily.  If the infection does not resolve, then I would recommend you see Dr. Mia Creek, ophthalmologist

## 2011-06-02 ENCOUNTER — Other Ambulatory Visit: Payer: Self-pay | Admitting: *Deleted

## 2011-06-02 MED ORDER — SIMVASTATIN 20 MG PO TABS: 20.0000 mg | ORAL_TABLET | Freq: Every day | ORAL | Status: DC

## 2011-08-24 ENCOUNTER — Other Ambulatory Visit: Payer: Self-pay | Admitting: Family Medicine

## 2011-08-24 NOTE — Telephone Encounter (Signed)
Pt is going out of town Wednesday. Please call medications in to pharm today

## 2011-09-08 ENCOUNTER — Other Ambulatory Visit (INDEPENDENT_AMBULATORY_CARE_PROVIDER_SITE_OTHER): Payer: 59

## 2011-09-08 DIAGNOSIS — Z0000 Encounter for general adult medical examination without abnormal findings: Secondary | ICD-10-CM

## 2011-09-08 LAB — POCT URINALYSIS DIPSTICK
Glucose, UA: NEGATIVE
Ketones, UA: NEGATIVE
Leukocytes, UA: NEGATIVE
Protein, UA: NEGATIVE
Spec Grav, UA: 1.015
Urobilinogen, UA: 0.2

## 2011-09-08 LAB — CBC WITH DIFFERENTIAL/PLATELET
Basophils Absolute: 0 10*3/uL (ref 0.0–0.1)
Eosinophils Absolute: 0.1 10*3/uL (ref 0.0–0.7)
Lymphocytes Relative: 33.5 % (ref 12.0–46.0)
MCHC: 33.7 g/dL (ref 30.0–36.0)
Monocytes Relative: 10.4 % (ref 3.0–12.0)
Platelets: 194 10*3/uL (ref 150.0–400.0)
RDW: 13.8 % (ref 11.5–14.6)

## 2011-09-08 LAB — BASIC METABOLIC PANEL
BUN: 14 mg/dL (ref 6–23)
CO2: 27 mEq/L (ref 19–32)
Calcium: 9.2 mg/dL (ref 8.4–10.5)
Creatinine, Ser: 0.9 mg/dL (ref 0.4–1.5)
GFR: 89.89 mL/min (ref >60.00)
Glucose, Bld: 97 mg/dL (ref 70–99)

## 2011-09-08 LAB — LIPID PANEL
Cholesterol: 185 mg/dL (ref 0–200)
HDL: 43.3 mg/dL
LDL Cholesterol: 118 mg/dL — ABNORMAL HIGH (ref 0–99)
Total CHOL/HDL Ratio: 4
Triglycerides: 118 mg/dL (ref 0.0–149.0)
VLDL: 23.6 mg/dL (ref 0.0–40.0)

## 2011-09-08 LAB — TSH: TSH: 1.44 u[IU]/mL (ref 0.35–5.50)

## 2011-09-08 LAB — HEPATIC FUNCTION PANEL
AST: 24 U/L (ref 0–37)
Albumin: 3.9 g/dL (ref 3.5–5.2)
Alkaline Phosphatase: 44 U/L (ref 39–117)
Total Bilirubin: 0.8 mg/dL (ref 0.3–1.2)

## 2011-09-22 ENCOUNTER — Encounter: Payer: Self-pay | Admitting: Family Medicine

## 2011-09-22 ENCOUNTER — Ambulatory Visit (INDEPENDENT_AMBULATORY_CARE_PROVIDER_SITE_OTHER): Payer: 59 | Admitting: Family Medicine

## 2011-09-22 DIAGNOSIS — J301 Allergic rhinitis due to pollen: Secondary | ICD-10-CM

## 2011-09-22 DIAGNOSIS — N529 Male erectile dysfunction, unspecified: Secondary | ICD-10-CM | POA: Insufficient documentation

## 2011-09-22 DIAGNOSIS — G2581 Restless legs syndrome: Secondary | ICD-10-CM

## 2011-09-22 DIAGNOSIS — E785 Hyperlipidemia, unspecified: Secondary | ICD-10-CM

## 2011-09-22 DIAGNOSIS — Z0000 Encounter for general adult medical examination without abnormal findings: Secondary | ICD-10-CM

## 2011-09-22 MED ORDER — SIMVASTATIN 20 MG PO TABS: 20.0000 mg | ORAL_TABLET | Freq: Every day | ORAL | Status: DC

## 2011-09-22 MED ORDER — ROPINIROLE HCL 2 MG PO TABS: 2.0000 mg | ORAL_TABLET | Freq: Every day | ORAL | Status: DC

## 2011-09-22 MED ORDER — SILDENAFIL CITRATE 100 MG PO TABS: 50.0000 mg | ORAL_TABLET | Freq: Every day | ORAL | Status: DC | PRN

## 2011-09-22 NOTE — Patient Instructions (Signed)
Continue your current medications  We will increase the Viagra to 100 mg,,,,,,,,,,,, which you can actually cut in 1/4  Return in one year sooner if any problems

## 2011-09-22 NOTE — Progress Notes (Signed)
  Subjective:    Patient ID: Casey Kim, male    DOB: 1956-04-05, 56 y.o.   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   HENT: Negative.   Eyes: Negative.   Respiratory: Negative.   Cardiovascular: Negative.   Gastrointestinal: Negative.   Genitourinary: Negative.   Musculoskeletal: Negative.   Skin: Negative.   Neurological: Negative.   Hematological: Negative.   Psychiatric/Behavioral: Negative.        Objective:   Physical Exam  Constitutional: He is oriented to person, place, and time. He appears well-developed and well-nourished.  HENT:  Head: Normocephalic and atraumatic.  Right Ear: External ear normal.  Left Ear: External ear normal.  Nose: Nose normal.  Mouth/Throat: Oropharynx is clear and moist.  Eyes: Conjunctivae and EOM are normal. Pupils are equal, round, and reactive to light.  Neck: Normal range of motion. Neck supple. No JVD present. No tracheal deviation present. No thyromegaly present.  Cardiovascular: Normal rate, regular rhythm, normal heart sounds and intact distal pulses.  Exam reveals no gallop and no friction rub.   No murmur heard. Pulmonary/Chest: Effort normal and breath sounds normal. No stridor. No respiratory distress. He has no wheezes. He has no rales. He exhibits no tenderness.  Abdominal: Soft. Bowel sounds are normal. He exhibits no distension and no mass. There is no tenderness. There is no rebound  and no guarding.  Genitourinary: Rectum normal, prostate normal and penis normal. Guaiac negative stool. No penile tenderness.       1 wart treated with 80% TCA  Musculoskeletal: Normal range of motion. He exhibits no edema and no tenderness.  Lymphadenopathy:    He has no cervical adenopathy.  Neurological: He is alert and oriented to person, place, and time. He has normal reflexes. No cranial nerve deficit. He exhibits normal muscle tone.  Skin: Skin is warm and dry. No rash noted. No erythema. No pallor.  Psychiatric: He has a normal mood and affect. His behavior is normal. Judgment and thought content normal.          Assessment & Plan:  Healthy male  Restless leg syndrome continue Requip 2 mg daily  Hyperlipidemia continue Zocor 20 and an aspirin tablet  Erectile dysfunction continue Viagra  Low back pain recommend daily stretching exercises

## 2012-04-06 ENCOUNTER — Telehealth: Payer: Self-pay | Admitting: Family Medicine

## 2012-04-06 DIAGNOSIS — H9190 Unspecified hearing loss, unspecified ear: Secondary | ICD-10-CM

## 2012-04-06 DIAGNOSIS — H9319 Tinnitus, unspecified ear: Secondary | ICD-10-CM

## 2012-04-06 NOTE — Telephone Encounter (Signed)
Pt would like to have a referral to Dr Jonny Ruiz May [phone] at [facility] for ringing in ears/hearing loss. Pt has [employer] ins and Dr May office would not allow him to make his own appt.

## 2012-04-18 ENCOUNTER — Ambulatory Visit (INDEPENDENT_AMBULATORY_CARE_PROVIDER_SITE_OTHER): Payer: 59 | Admitting: Family Medicine

## 2012-04-18 ENCOUNTER — Encounter: Payer: Self-pay | Admitting: Family Medicine

## 2012-04-18 VITALS — BP 120/88 | Temp 98.0°F | Wt 198.0 lb

## 2012-04-18 DIAGNOSIS — K645 Perianal venous thrombosis: Secondary | ICD-10-CM

## 2012-04-18 NOTE — Progress Notes (Signed)
  Subjective:    Patient ID: Casey Kim, male    DOB: 1955-06-28, 56 y.o.   MRN: [id number]  HPI Casey Kim is a 56 year old male who comes in today for evaluation of 2 thrombosed hemorrhoids  He said over the weekend he noticed some pain swelling and bleeding. One of the hemorrhoids as drain the other one is still swollen and not draining. No history of trauma constipation etc.   Review of Systems GI review of systems otherwise negative bowel habits normal    Objective:   Physical Exam  Well-developed well-nourished male in no acute distress examination rectum shows a thrombosed hemorrhoid at 12:00 position with a clot this is a 1 has been bleeding also 8 marble size thrombosed hemorrhoid at the 9:00 position partially in the anal canal that is not bleeding      Assessment & Plan:  Hemorrhoids x2 plan outlined program return when necessary

## 2012-04-18 NOTE — Patient Instructions (Signed)
Warm soaks twice daily, stool softener, apply the medicated cream twice daily  Return when necessary

## 2012-05-27 ENCOUNTER — Other Ambulatory Visit: Payer: Self-pay | Admitting: Family Medicine

## 2012-05-27 DIAGNOSIS — G2581 Restless legs syndrome: Secondary | ICD-10-CM

## 2012-06-10 ENCOUNTER — Ambulatory Visit (INDEPENDENT_AMBULATORY_CARE_PROVIDER_SITE_OTHER): Payer: 59 | Admitting: Neurology

## 2012-06-10 ENCOUNTER — Encounter: Payer: Self-pay | Admitting: Neurology

## 2012-06-10 VITALS — BP 117/70 | HR 76 | Temp 98.2°F | Resp 12 | Ht 69.0 in | Wt 197.0 lb

## 2012-06-10 DIAGNOSIS — G2581 Restless legs syndrome: Secondary | ICD-10-CM

## 2012-06-10 DIAGNOSIS — R251 Tremor, unspecified: Secondary | ICD-10-CM

## 2012-06-10 DIAGNOSIS — R259 Unspecified abnormal involuntary movements: Secondary | ICD-10-CM

## 2012-06-10 MED ORDER — CLONAZEPAM 0.5 MG PO TABS: 0.5000 mg | ORAL_TABLET | Freq: Every evening | ORAL | Status: DC | PRN

## 2012-06-10 MED ORDER — ROPINIROLE HCL 3 MG PO TABS: 3.0000 mg | ORAL_TABLET | Freq: Every day | ORAL | Status: DC

## 2012-06-10 NOTE — Progress Notes (Addendum)
Casey Kim is a 57 year old male with a history of restless legs syndrome. This appears to be a primary, inherited form of restless leg syndrome. His mother, who is still living in an pretty good health, has restless leg syndrome also. However, his brothers do not have it. His father did not have it. Neither parent had a significant tremor that he knows of. His father died in his 76s.  Casey Kim began with the symptoms in childhood. He had the symptoms in the daytime including at the movies and especially at night when the symptoms were most prominent. His wife has noticed that he doesn't have medicine he will bicycle and kick his legs all night. He is currently on Requip which she's been taking for the last several years without side effects. He has increased the dosage to 2 mg and he estimates that its effect of about 80% of the time. He also has to take it for a clock or a doesn't seem to work. Also if he takes it for 7 he sometimes gets sleepy and drowsy.  It is at a social event and has a couple drinks he prefers not to take the Requip because he gets home late and it doesn't seem to work as well anyway. He wonders if there something else he can take in these occasional instances that might be effective.  He applied for long-term care insurance and there was mention of a mild tremor that cause him concern in his application was denied. He is now referred for further evaluation of the tremor. This has been present for about 2 years he notices it a little bit more on the left than the right. Occasionally diffuse eating soup he will notice the tremor as he brings the food up to his mouth, but it doesn't stop her from doing any of his normal activities. He can fish and high hopes onto the line without a problem. He is also occasionally present at rest but just. He has no changes in his gait. He has no difficulty getting around. There is no micrographia. There is no change in his facial expressions. He was up full  and active lifestyle and feels that he is pretty healthy.  Review of symptoms is positive for hemorrhoids and ED. The remainder of review of symptoms is negative  Past Medical History  Diagnosis Date  . Allergic rhinitis   . Hyperlipidemia   . Restless leg syndrome   . Diverticulitis   . Hearing loss     Current Outpatient Prescriptions on File Prior to Visit  Medication Sig Dispense Refill  . aspirin 81 MG tablet Take 81 mg by mouth daily.        Marland Kitchen omeprazole (PRILOSEC) 10 MG capsule Take 10 mg by mouth daily.      Marland Kitchen rOPINIRole (REQUIP) 2 MG tablet Take 1 tablet (2 mg total) by mouth at bedtime.  90 tablet  3  . simvastatin (ZOCOR) 20 MG tablet Take 1 tablet (20 mg total) by mouth at bedtime.  100 tablet  3  . clonazePAM (KLONOPIN) 0.5 MG tablet Take 1 tablet (0.5 mg total) by mouth at bedtime as needed for anxiety.  15 tablet  1   Fish oil allergy  History   Social History  . Marital Status: Married    Spouse Name: N/A    Number of Children: N/A  . Years of Education: N/A   Occupational History  . Not on file.   Social History Main Topics  . Smoking  status: Never Smoker   . Smokeless tobacco: Former Neurosurgeon  . Alcohol Use: Yes     Comment: 2-3 beers a day  . Drug Use: Yes     Comment: marijuana weekly  . Sexually Active: Not on file   Other Topics Concern  . Not on file   Social History Narrative  . No narrative on file   No family history on file.  He notes family istory of rls in his mother.  BP 117/70  Pulse 76  Temp 98.2 F (36.8 C)  Resp 12  Ht 5\' 9"  (1.753 m)  Wt 197 lb (89.359 kg)  BMI 29.09 kg/m2   Alert and oriented x 3.  Memory function appears to be intact.  Concentration and attention are normal for educational level and background.  Speech is fluent and without significant word finding difficulty.  Is aware of current events.  No carotid bruits detected.  Cranial nerve II through XII are within normal limits.  This includes normal optic discs and  acuity, EOMI, PERLA, facial movement and sensation intact, hearing grossly intact, gag intact,Uvula raises symmetrically and tongue protrudes evenly. Motor strength is 5 over 5 throughout all limbs.  No atrophy, abnormal tone including no cogwheeling.  There is a very slight tremor of the left thumb noted in some positions including with his hand on his lap. With the Archimedes spiral there is some evidence of this tremor on the left only. There is no micrographia. With intention with an extension of a 12 and stick, he has a slight intention tremor more on the left when reaching towards a stationary object such as my finger. Reflexes are 2+ and symmetric in the upper and lower extremities toes downgoing Sensory exam is intact. Coordination is intact for fine movements and rapid alternating movements in all limbs Gait and station are normal.   Impression: 1. Primary restless leg syndrome with onset in childhood and a familiar pattern of inheritance from his mother. The current medication of Requip is well-tolerated and it is 80% effective in his estimation. There are also some situations where he is not able to take the Requip in time to be effective because of the window in which Requip works for him.  2. 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  Plan: 1.  He can try Requip 3 mg to see if this is also well tolerated and affective and high percentage of nights.  The daytime symptoms ever tend to increase we can discuss other dosing schedules. 2. If he is not able to take the Requip within the affected window, he can try an occasional clonazepam 0.5 mg which is typically very effective for restless leg syndrome.  As we are prescribing the medications, I would like to see him  In 6 months to see if they are effective and if they need any adjustment.  3. As far as the tremor, this is not appear to be a Parkinson's type of tremor. I see no evidence of  cogwheeling or other symptoms to suggest any indication of Parkinson's. As the restless legs syndrome isn't the primary an inherited type, it is not associated with significantly increased risk of Parkinson's in the future.  We can take a look at the tremor again in 12 months to see if there's been any significant change.

## 2012-09-15 ENCOUNTER — Other Ambulatory Visit: Payer: 59

## 2012-09-22 ENCOUNTER — Encounter: Payer: 59 | Admitting: Family Medicine

## 2012-11-07 ENCOUNTER — Other Ambulatory Visit (INDEPENDENT_AMBULATORY_CARE_PROVIDER_SITE_OTHER): Payer: 59

## 2012-11-07 DIAGNOSIS — Z0000 Encounter for general adult medical examination without abnormal findings: Secondary | ICD-10-CM

## 2012-11-07 LAB — CBC WITH DIFFERENTIAL/PLATELET
Basophils Absolute: 0 10*3/uL (ref 0.0–0.1)
Basophils Relative: 0.7 % (ref 0.0–3.0)
Hemoglobin: 16.1 g/dL (ref 13.0–17.0)
Lymphocytes Relative: 35.9 % (ref 12.0–46.0)
Monocytes Relative: 10.8 % (ref 3.0–12.0)
Neutro Abs: 2.1 10*3/uL (ref 1.4–7.7)
Neutrophils Relative %: 48.2 % (ref 43.0–77.0)
RBC: 4.62 Mil/uL (ref 4.22–5.81)

## 2012-11-07 LAB — POCT URINALYSIS DIPSTICK
Blood, UA: NEGATIVE
Glucose, UA: NEGATIVE
Ketones, UA: NEGATIVE
Spec Grav, UA: 1.02
Urobilinogen, UA: 1

## 2012-11-07 LAB — BASIC METABOLIC PANEL
BUN: 14 mg/dL (ref 6–23)
CO2: 30 mEq/L (ref 19–32)
Chloride: 105 mEq/L (ref 96–112)
Creatinine, Ser: 0.9 mg/dL (ref 0.4–1.5)

## 2012-11-07 LAB — HEPATIC FUNCTION PANEL
AST: 17 U/L (ref 0–37)
Albumin: 4.1 g/dL (ref 3.5–5.2)
Alkaline Phosphatase: 47 U/L (ref 39–117)
Bilirubin, Direct: 0.1 mg/dL (ref 0.0–0.3)
Total Protein: 6.7 g/dL (ref 6.0–8.3)

## 2012-11-07 LAB — PSA: PSA: 0.57 ng/mL (ref 0.10–4.00)

## 2012-11-07 LAB — LIPID PANEL
Cholesterol: 232 mg/dL — ABNORMAL HIGH (ref 0–200)
Total CHOL/HDL Ratio: 5
Triglycerides: 193 mg/dL — ABNORMAL HIGH (ref 0.0–149.0)
VLDL: 38.6 mg/dL (ref 0.0–40.0)

## 2012-11-14 ENCOUNTER — Ambulatory Visit (INDEPENDENT_AMBULATORY_CARE_PROVIDER_SITE_OTHER): Payer: 59 | Admitting: Family Medicine

## 2012-11-14 ENCOUNTER — Encounter: Payer: Self-pay | Admitting: Family Medicine

## 2012-11-14 VITALS — BP 120/84 | Temp 98.8°F | Ht 68.0 in | Wt 195.0 lb

## 2012-11-14 DIAGNOSIS — N529 Male erectile dysfunction, unspecified: Secondary | ICD-10-CM

## 2012-11-14 DIAGNOSIS — E785 Hyperlipidemia, unspecified: Secondary | ICD-10-CM

## 2012-11-14 DIAGNOSIS — K645 Perianal venous thrombosis: Secondary | ICD-10-CM

## 2012-11-14 DIAGNOSIS — G2581 Restless legs syndrome: Secondary | ICD-10-CM

## 2012-11-14 MED ORDER — CLONAZEPAM 0.5 MG PO TABS: 0.5000 mg | ORAL_TABLET | Freq: Every evening | ORAL | Status: DC | PRN

## 2012-11-14 MED ORDER — SIMVASTATIN 20 MG PO TABS: 20.0000 mg | ORAL_TABLET | Freq: Every day | ORAL | Status: DC

## 2012-11-14 MED ORDER — ROPINIROLE HCL 3 MG PO TABS: 3.0000 mg | ORAL_TABLET | Freq: Every day | ORAL | Status: DC

## 2012-11-14 NOTE — Progress Notes (Signed)
  Subjective:    Patient ID: ABIMAEL ZEITER, male    DOB: 09-22-1955, 57 y.o.   MRN: [id number]  HPI  Raji is a 57 year old married male nonsmoker who comes in today for general physical examination  For hyperlipidemia he takes Zocor and aspirin  He takes Klonopin 0.5 each bedtime along with 3 mg of workup for restless leg syndrome. We get initial evaluation and he subsequently referred him to neurology. They do not have much else to offer.  He gets routine eye care, dental work, colonoscopy and GI, tetanus 2005 booster due in the year  Review of Systems  Constitutional: Negative.   HENT: Negative.   Eyes: Negative.   Respiratory: Negative.   Cardiovascular: Negative.   Gastrointestinal: Negative.   Genitourinary: Negative.   Musculoskeletal: Negative.   Skin: Negative.   Neurological: Negative.   Psychiatric/Behavioral: Negative.        Objective:   Physical Exam  Nursing note and vitals reviewed. Constitutional: He is oriented to person, place, and time. He appears well-developed and well-nourished.  HENT:  Head: Normocephalic and atraumatic.  Right Ear: External ear normal.  Left Ear: External ear normal.  Nose: Nose normal.  Mouth/Throat: Oropharynx is clear and moist.  Eyes: Conjunctivae and EOM are normal. Pupils are equal, round, and reactive to light.  Neck: Normal range of motion. Neck supple. No JVD present. No tracheal deviation present. No thyromegaly present.  Cardiovascular: Normal rate, regular rhythm, normal heart sounds and intact distal pulses.  Exam reveals no gallop and no friction rub.   No murmur heard. Pulmonary/Chest: Effort normal and breath sounds normal. No stridor. No respiratory distress. He has no wheezes. He has no rales. He exhibits no tenderness.  Abdominal: Soft. Bowel sounds are normal. He exhibits no distension and no mass. There is no tenderness. There is no rebound and no guarding.  Genitourinary: Rectum normal, prostate normal and  penis normal. Guaiac negative stool. No penile tenderness.  2 is his penis this were treated with KOH  Musculoskeletal: Normal range of motion. He exhibits no edema and no tenderness.  Lymphadenopathy:    He has no cervical adenopathy.  Neurological: He is alert and oriented to person, place, and time. He has normal reflexes. No cranial nerve deficit. He exhibits normal muscle tone.  Skin: Skin is warm and dry. No rash noted. No erythema. No pallor.  Psychiatric: He has a normal mood and affect. His behavior is normal. Judgment and thought content normal.          Assessment & Plan:  Healthy male  History is restless leg syndrome continue Klonopin and Requip each bedtime consider stopping the Zocor  Hyperlipidemia and stopped the Zocor because of the restless leg syndrome for couple months and see if that doesn't help  2 warts on his penis painted with KOH

## 2012-11-14 NOTE — Patient Instructions (Signed)
Continue the Requip 3 mg at bedtime and Klonopin when necessary  Hold the Zocor for 2 months to see if has any effect on your leg pain  Call me in 2 months leave me a voicemail to call you back and we'll discuss how you feel

## 2013-11-20 ENCOUNTER — Encounter: Payer: Self-pay | Admitting: Family Medicine

## 2013-11-20 ENCOUNTER — Ambulatory Visit (INDEPENDENT_AMBULATORY_CARE_PROVIDER_SITE_OTHER): Payer: 59 | Admitting: Family Medicine

## 2013-11-20 VITALS — BP 130/90 | HR 68 | Temp 98.1°F | Wt 196.0 lb

## 2013-11-20 DIAGNOSIS — K625 Hemorrhage of anus and rectum: Secondary | ICD-10-CM | POA: Insufficient documentation

## 2013-11-20 LAB — CBC
HCT: 45.8 % (ref 39.0–52.0)
Hemoglobin: 15.6 g/dL (ref 13.0–17.0)
MCHC: 34.1 g/dL (ref 30.0–36.0)
MCV: 100 fl (ref 78.0–100.0)
Platelets: 210 10*3/uL (ref 150.0–400.0)
RBC: 4.58 Mil/uL (ref 4.22–5.81)
RDW: 13.4 % (ref 11.5–15.5)
WBC: 4.1 10*3/uL (ref 4.0–10.5)

## 2013-11-20 LAB — POC HEMOCCULT BLD/STL (OFFICE/1-CARD/DIAGNOSTIC): Fecal Occult Blood, POC: POSITIVE

## 2013-11-20 NOTE — Progress Notes (Signed)
Garret Reddish, MD Phone: [phone]  Subjective:   Casey Kim is a 58 y.o. year old very pleasant male patient who presents with the following:  Rectal Bleeding Patient had a normal bowel movement yesterday but then looked in the toilet and noted bright red blood throughout the toilet bowel. He has a history of a hemorrhoid tag but had not had any issues recently. He states he had a small amount of blood one month ago and believes his stools have been much darker since that time (? Melena). Patient admits to worsening fatigue over last month as well. Not currently taking aspirin for at least 1 month. He has not seen surgery who performed operation in a # of years. His last colonoscopy was in 2007. He had a normal bowel movement today without blood. It was not dark either.   ROS- no chest pain or shortness of breath. Fatigue did not acutely worsen since last night.  Past Medical/surgical History- history of partial colectomy due to diverticulitis before 2000, erectile dysfunction, HLD, restless legs, seasonal allergies  Medications- reviewed and updated Current Outpatient Prescriptions  Medication Sig Dispense Refill  . clonazePAM (KLONOPIN) 0.5 MG tablet Take 1 tablet (0.5 mg total) by mouth at bedtime as needed for anxiety.  60 tablet  3  . omeprazole (PRILOSEC) 10 MG capsule Take 10 mg by mouth daily.      Marland Kitchen rOPINIRole (REQUIP) 3 MG tablet Take 1 tablet (3 mg total) by mouth at bedtime.  100 tablet  3  . simvastatin (ZOCOR) 20 MG tablet Take 1 tablet (20 mg total) by mouth at bedtime.  100 tablet  3   Objective: BP 130/90  Pulse 68  Temp(Src) 98.1 F (36.7 C) (Oral)  Wt 196 lb (88.905 kg) Gen: NAD, resting comfortably on table HEENT: no mucus membrane pallor CV: RRR no murmurs rubs or gallops Lungs: CTAB no crackles, wheeze, rhonchi Abdomen: soft/nontender/nondistended/normal bowel sounds. No rebound or guarding.  Ext: no edema Skin: warm, dry Rectal: hemorrhoid tag at 6  o clock. No active bleeding. Prostate normal. FOBT+ but not grossly bloody.   Results for orders placed in visit on 11/20/13 (from the past 24 hour(s))  CBC     Status: None   Collection Time    11/20/13 10:37 AM      Result Value Ref Range   WBC 4.1  4.0 - 10.5 K/uL   RBC 4.58  4.22 - 5.81 Mil/uL   Platelets 210.0  150.0 - 400.0 K/uL   Hemoglobin 15.6  13.0 - 17.0 g/dL   HCT 45.8  39.0 - 52.0 %   MCV 100.0  78.0 - 100.0 fl   MCHC 34.1  30.0 - 36.0 g/dL   RDW 13.4  11.5 - 15.5 %  POC HEMOCCULT BLD/STL (OFFICE/1-CARD/DIAGNOSTIC)     Status: Abnormal   Collection Time    11/20/13 10:51 AM      Result Value Ref Range   Card #1 Date 11/20/13     Fecal Occult Blood, POC Positive      Assessment/Plan:  Rectal bleeding BRBPR yesterday and FOBT + today in patient with history of partial colectomy. CBC today without anemia. Have referred patient to GI at this time for repeat colonoscopy and consideration of endoscopy given ? Melena. Bleed certainly could be from remaining diverticula, hopeful not from anastomosis site but colonoscopy will give further information.   Platelets also normal.   Orders Placed This Encounter  Procedures  . CBC  [place]  . Ambulatory referral to Gastroenterology    Referral Priority:  Routine    Referral Type:  Consultation    Referral Reason:  Specialty Services Required    Requested Specialty:  Gastroenterology    Number of Visits Requested:  1  . POC Hemoccult Bld/Stl (1-Cd Office Dx)

## 2013-11-20 NOTE — Assessment & Plan Note (Signed)
BRBPR yesterday and FOBT + today in patient with history of partial colectomy. CBC today without anemia. Have referred patient to GI at this time for repeat colonoscopy and consideration of endoscopy given ? Melena. Bleed certainly could be from remaining diverticula, hopeful not from anastomosis site but colonoscopy will give further information.

## 2013-11-20 NOTE — Progress Notes (Signed)
Pre visit review using our clinic review tool, if applicable. No additional management support is needed unless otherwise documented below in the visit note. 

## 2013-11-20 NOTE — Patient Instructions (Signed)
Rectal Bleeding  Check blood counts  Send you to stomach doctor (GI) for colonoscopy, consideration of endoscopy  Reasons for return: worsening fatigue, recurrent bleeds, bleeds that do not stop, chest pain or shortness of breath

## 2013-11-21 ENCOUNTER — Ambulatory Visit (INDEPENDENT_AMBULATORY_CARE_PROVIDER_SITE_OTHER): Payer: 59 | Admitting: Internal Medicine

## 2013-11-21 ENCOUNTER — Encounter: Payer: Self-pay | Admitting: Internal Medicine

## 2013-11-21 VITALS — BP 110/80 | HR 80 | Ht 68.0 in | Wt 196.4 lb

## 2013-11-21 DIAGNOSIS — K625 Hemorrhage of anus and rectum: Secondary | ICD-10-CM

## 2013-11-21 MED ORDER — BENEFIBER PO POWD: ORAL | Status: DC

## 2013-11-21 NOTE — Patient Instructions (Signed)
You have been scheduled for a colonoscopy. Please follow written instructions given to you at your visit today.  Please pick up your prep supplies at the pharmacy. If you use inhalers (even only as needed), please bring them with you on the day of your procedure. Your physician has requested that you go to www.startemmi.com and enter the access code given to you at your visit today. This web site gives a general overview about your procedure. However, you should still follow specific instructions given to you by our office regarding your preparation for the procedure.  Today you have been given a hemorrhoid handout.  Today you have been given a handout on benefiber to read and follow.  I appreciate the opportunity to care for you.

## 2013-11-21 NOTE — Progress Notes (Signed)
   Subjective:    Patient ID: Casey Kim, male    DOB: March 21, 1956, 58 y.o.   MRN: [id number]  HPI Patient is here with his wife, he is known to me from prior colonoscopy about 8 years ago that was negative. He's had some intermittent minor rectal bleeding off and on but more recently, 2-3 days ago he had a larger amount of blood which concerned him. He was seen by primary care. Hemoglobin was normal. He reports having a swollen painful hemorrhoid about 7 months ago after a long car trip to [place]. Denies abdominal pain. He does have infrequent defecation at times. Tends towards constipation and strains and sit on the toilet for long periods of time, and reads.  Medications, allergies, past medical history, past surgical history, family history and social history are reviewed and updated in the EMR.  Review of Systems As above    Objective:   Physical Exam General:  NAD Eyes:   anicteric Lungs:  clear Heart:  S1S2 no rubs, murmurs or gallops Abdomen:  soft and nontender, BS+ Ext:   no edema  Rectal:  Small RA anterior tag, normal tone, brown stool and NL prostate   Anoscopy was performed with the patient in the left lateral decubitus position and revealed Grade I internal hemorrhoids all 3 positions     Assessment & Plan:  Rectal bleeding  Most likely from hemorrhoids. 8 yrs since colonoscopy so will schedule The risks and benefits as well as alternatives of endoscopic procedure(s) have been discussed and reviewed. All questions answered. The patient agrees to proceed.  Topical hemorrhoid Tx and handout for now Consider banding  I appreciate the opportunity to care for this patient. CC: Dr. Yong Channel

## 2013-11-22 ENCOUNTER — Encounter: Payer: Self-pay | Admitting: Internal Medicine

## 2013-11-26 ENCOUNTER — Other Ambulatory Visit: Payer: Self-pay | Admitting: Family Medicine

## 2013-11-28 ENCOUNTER — Ambulatory Visit (AMBULATORY_SURGERY_CENTER): Payer: 59 | Admitting: Internal Medicine

## 2013-11-28 ENCOUNTER — Encounter: Payer: Self-pay | Admitting: Internal Medicine

## 2013-11-28 VITALS — BP 136/79 | HR 56 | Temp 97.8°F | Resp 27 | Ht 68.0 in | Wt 196.0 lb

## 2013-11-28 DIAGNOSIS — D126 Benign neoplasm of colon, unspecified: Secondary | ICD-10-CM

## 2013-11-28 DIAGNOSIS — K648 Other hemorrhoids: Secondary | ICD-10-CM | POA: Insufficient documentation

## 2013-11-28 DIAGNOSIS — K625 Hemorrhage of anus and rectum: Secondary | ICD-10-CM

## 2013-11-28 DIAGNOSIS — K5730 Diverticulosis of large intestine without perforation or abscess without bleeding: Secondary | ICD-10-CM

## 2013-11-28 MED ORDER — SODIUM CHLORIDE 0.9 % IV SOLN: 500.0000 mL | INTRAVENOUS | Status: DC

## 2013-11-28 NOTE — Op Note (Signed)
[place]  [address] [place], [address]   COLONOSCOPY PROCEDURE REPORT  PATIENT: Casey Kim, Casey Kim  MR#: [id number] BIRTHDATE: 04-09-1956 , 54  yrs. old GENDER: Male ENDOSCOPIST: Gatha Mayer, MD, [facility] PROCEDURE DATE:  11/28/2013 PROCEDURE:   Colonoscopy with snare polypectomy First Screening Colonoscopy - Avg.  risk and is 50 yrs.  old or older - No.  Prior Negative Screening - Now for repeat screening. N/A      Polyps Removed Today? Yes. ASA CLASS:   Class II INDICATIONS:Rectal Bleeding. MEDICATIONS: propofol (Diprivan) 300mg  IV, MAC sedation, administered by CRNA, and These medications were titrated to patient response per physician's verbal order  DESCRIPTION OF PROCEDURE:   After the risks benefits and alternatives of the procedure were thoroughly explained, informed consent was obtained.  A digital rectal exam revealed no abnormalities of the rectum, A digital rectal exam revealed no prostatic nodules, and A digital rectal exam revealed the prostate was not enlarged.   The LB [id number]  endoscope was introduced through the anus and advanced to the cecum, which was identified by both the appendix and ileocecal valve. No adverse events experienced.   The quality of the prep was excellent using Suprep  The instrument was then slowly withdrawn as the colon was fully examined.  COLON FINDINGS: Two diminutive sessile polyps were found at the cecum and in the descending colon.  A polypectomy was performed with a cold snare.  The resection was complete and the polyp tissue was completely retrieved.   Mild diverticulosis was noted The finding was in the left colon.   There was evidence of a prior colo-colonic surgical anastomosis in the sigmoid colon.   Internal hemorrhoids were found.   The colon mucosa was otherwise normal. A right colon retroflexion was performed.  Retroflexed views revealed internal hemorrhoids. The time to cecum=minutes  53 seconds.  Withdrawal time=10 minutes 09 seconds.  The scope was withdrawn and the procedure completed. COMPLICATIONS: There were no complications.  ENDOSCOPIC IMPRESSION: 1.   Two diminutive sessile polyps were found at the cecum and in the descending colon; polypectomy was performed with a cold snare 2.   Mild diverticulosis was noted in the left colon 3.   There was evidence of a prior colo-colonic surgical anastomosis in the sigmoid colon 4.   Internal hemorrhoids 5.   The colon mucosa was otherwise normal - excellent prep  RECOMMENDATIONS: 1.  Timing of repeat colonoscopy will be determined by pathology findings. 2.   Consider hemorrhoid banding for hemorrhoids  eSigned:  Gatha Mayer, MD, [facility] 11/28/2013 10:43 AM   cc: Dorena Cookey, MD and The Patient

## 2013-11-28 NOTE — Progress Notes (Signed)
Report to PACU, RN, vss, BBS= Clear.  

## 2013-11-28 NOTE — Progress Notes (Signed)
Called to room to assist during endoscopic procedure.  Patient ID and intended procedure confirmed with present staff. Received instructions for my participation in the procedure from the performing physician.  

## 2013-11-28 NOTE — Patient Instructions (Addendum)
I found and removed 2 tiny polyps that look benign.  You also have a condition called diverticulosis - common and not usually a problem. Please read the handout provided. (not much of this left) Take Benefiber (generic ok) 1-2 tablespoons daily for easier bowel movements. If you have persistent hemorrhoid problems (swelling, itching, bleeding) I am able to treat those with an in-office procedure. If you like, please call my office at [phone] to schedule an appointment and I can evaluate you further.  I will let you know pathology results and when to have another routine colonoscopy by mail.  I appreciate the opportunity to care for you. Gatha Mayer, MD, FACG  YOU HAD AN ENDOSCOPIC PROCEDURE TODAY AT [facility]: Refer to the procedure report that was given to you for any specific questions about what was found during the examination.  If the procedure report does not answer your questions, please call your gastroenterologist to clarify.  If you requested that your care partner not be given the details of your procedure findings, then the procedure report has been included in a sealed envelope for you to review at your convenience later.  YOU SHOULD EXPECT: Some feelings of bloating in the abdomen. Passage of more gas than usual.  Walking can help get rid of the air that was put into your GI tract during the procedure and reduce the bloating. If you had a lower endoscopy (such as a colonoscopy or flexible sigmoidoscopy) you may notice spotting of blood in your stool or on the toilet paper. If you underwent a bowel prep for your procedure, then you may not have a normal bowel movement for a few days.  DIET: Your first meal following the procedure should be a light meal and then it is ok to progress to your normal diet.  A half-sandwich or bowl of soup is an example of a good first meal.  Heavy or fried foods are harder to digest and may make you feel nauseous or bloated.  Likewise  meals heavy in dairy and vegetables can cause extra gas to form and this can also increase the bloating.  Drink plenty of fluids but you should avoid alcoholic beverages for 24 hours.  ACTIVITY: Your care partner should take you home directly after the procedure.  You should plan to take it easy, moving slowly for the rest of the day.  You can resume normal activity the day after the procedure however you should NOT DRIVE or use heavy machinery for 24 hours (because of the sedation medicines used during the test).    SYMPTOMS TO REPORT IMMEDIATELY: A gastroenterologist can be reached at any hour.  During normal business hours, 8:30 AM to 5:00 PM Monday through Friday, call [phone].  After hours and on weekends, please call the GI answering service at [phone] who will take a message and have the physician on call contact you.   Following lower endoscopy (colonoscopy or flexible sigmoidoscopy):  Excessive amounts of blood in the stool  Significant tenderness or worsening of abdominal pains  Swelling of the abdomen that is new, acute  Fever of 100F or higher  FOLLOW UP: If any biopsies were taken you will be contacted by phone or by letter within the next 1-3 weeks.  Call your gastroenterologist if you have not heard about the biopsies in 3 weeks.  Our staff will call the home number listed on your records the next business day following your procedure to check on  you and address any questions or concerns that you may have at that time regarding the information given to you following your procedure. This is a courtesy call and so if there is no answer at the home number and we have not heard from you through the emergency physician on call, we will assume that you have returned to your regular daily activities without incident.  SIGNATURES/CONFIDENTIALITY: You and/or your care partner have signed paperwork which will be entered into your electronic medical record.  These signatures  attest to the fact that that the information above on your After Visit Summary has been reviewed and is understood.  Full responsibility of the confidentiality of this discharge information lies with you and/or your care-partner.  Recommendations Next colonoscopy determined by pathology findings. Consider hemorrhoid banding

## 2013-11-29 ENCOUNTER — Telehealth: Payer: Self-pay | Admitting: *Deleted

## 2013-11-29 NOTE — Telephone Encounter (Signed)
Name identifier, left message, follow-up 

## 2013-12-07 ENCOUNTER — Encounter: Payer: Self-pay | Admitting: Internal Medicine

## 2013-12-07 NOTE — Progress Notes (Signed)
Quick Note:  Polyps not pre-cancerous Repeat colon 2025 ______

## 2013-12-12 ENCOUNTER — Other Ambulatory Visit: Payer: 59

## 2013-12-13 ENCOUNTER — Other Ambulatory Visit (INDEPENDENT_AMBULATORY_CARE_PROVIDER_SITE_OTHER): Payer: 59

## 2013-12-13 DIAGNOSIS — Z0000 Encounter for general adult medical examination without abnormal findings: Secondary | ICD-10-CM

## 2013-12-13 LAB — POCT URINALYSIS DIPSTICK
Bilirubin, UA: NEGATIVE
Blood, UA: NEGATIVE
Glucose, UA: NEGATIVE
KETONES UA: NEGATIVE
LEUKOCYTES UA: NEGATIVE
NITRITE UA: NEGATIVE
Spec Grav, UA: 1.015
Urobilinogen, UA: 0.2
pH, UA: 7

## 2013-12-13 LAB — BASIC METABOLIC PANEL
BUN: 9 mg/dL (ref 6–23)
CHLORIDE: 103 meq/L (ref 96–112)
CO2: 28 mEq/L (ref 19–32)
CREATININE: 0.9 mg/dL (ref 0.4–1.5)
Calcium: 9.4 mg/dL (ref 8.4–10.5)
GFR: 89.19 mL/min (ref >60.00)
Glucose, Bld: 92 mg/dL (ref 70–99)
Potassium: 4.7 mEq/L (ref 3.5–5.1)
Sodium: 138 mEq/L (ref 135–145)

## 2013-12-13 LAB — CBC WITH DIFFERENTIAL/PLATELET
Basophils Absolute: 0 10*3/uL (ref 0.0–0.1)
Basophils Relative: 0.8 % (ref 0.0–3.0)
EOS ABS: 0.1 10*3/uL (ref 0.0–0.7)
Eosinophils Relative: 2.5 % (ref 0.0–5.0)
HCT: 46.2 % (ref 39.0–52.0)
Hemoglobin: 16 g/dL (ref 13.0–17.0)
Lymphocytes Relative: 20.3 % (ref 12.0–46.0)
Lymphs Abs: 1.1 10*3/uL (ref 0.7–4.0)
MCHC: 34.6 g/dL (ref 30.0–36.0)
MCV: 98.7 fl (ref 78.0–100.0)
Monocytes Absolute: 0.6 10*3/uL (ref 0.1–1.0)
Monocytes Relative: 11.2 % (ref 3.0–12.0)
NEUTROS ABS: 3.6 10*3/uL (ref 1.4–7.7)
Neutrophils Relative %: 65.2 % (ref 43.0–77.0)
Platelets: 216 10*3/uL (ref 150.0–400.0)
RBC: 4.68 Mil/uL (ref 4.22–5.81)
RDW: 13.6 % (ref 11.5–15.5)
WBC: 5.5 10*3/uL (ref 4.0–10.5)

## 2013-12-13 LAB — LIPID PANEL
Cholesterol: 204 mg/dL — ABNORMAL HIGH (ref 0–200)
HDL: 44.2 mg/dL (ref >39.00)
LDL CALC: 128 mg/dL — AB (ref 0–99)
NONHDL: 159.8
Total CHOL/HDL Ratio: 5
Triglycerides: 160 mg/dL — ABNORMAL HIGH (ref 0.0–149.0)
VLDL: 32 mg/dL (ref 0.0–40.0)

## 2013-12-13 LAB — TSH: TSH: 2 u[IU]/mL (ref 0.35–4.50)

## 2013-12-13 LAB — HEPATIC FUNCTION PANEL
ALBUMIN: 4.1 g/dL (ref 3.5–5.2)
ALT: 26 U/L (ref 0–53)
AST: 22 U/L (ref 0–37)
Alkaline Phosphatase: 50 U/L (ref 39–117)
Bilirubin, Direct: 0.2 mg/dL (ref 0.0–0.3)
Total Bilirubin: 1.2 mg/dL (ref 0.2–1.2)
Total Protein: 6.8 g/dL (ref 6.0–8.3)

## 2013-12-13 LAB — PSA: PSA: 0.61 ng/mL (ref 0.10–4.00)

## 2013-12-19 ENCOUNTER — Encounter: Payer: Self-pay | Admitting: Family Medicine

## 2013-12-19 ENCOUNTER — Ambulatory Visit (INDEPENDENT_AMBULATORY_CARE_PROVIDER_SITE_OTHER): Payer: 59 | Admitting: Family Medicine

## 2013-12-19 VITALS — BP 138/88 | Temp 98.1°F | Ht 68.0 in | Wt 195.0 lb

## 2013-12-19 DIAGNOSIS — G2581 Restless legs syndrome: Secondary | ICD-10-CM

## 2013-12-19 DIAGNOSIS — Z2911 Encounter for prophylactic immunotherapy for respiratory syncytial virus (RSV): Secondary | ICD-10-CM

## 2013-12-19 DIAGNOSIS — K219 Gastro-esophageal reflux disease without esophagitis: Secondary | ICD-10-CM

## 2013-12-19 DIAGNOSIS — Z23 Encounter for immunization: Secondary | ICD-10-CM

## 2013-12-19 DIAGNOSIS — E785 Hyperlipidemia, unspecified: Secondary | ICD-10-CM

## 2013-12-19 MED ORDER — OMEPRAZOLE 10 MG PO CPDR
10.0000 mg | DELAYED_RELEASE_CAPSULE | Freq: Every day | ORAL | Status: DC
Start: 2013-12-19 — End: 2015-07-29

## 2013-12-19 MED ORDER — SIMVASTATIN 20 MG PO TABS: 20.0000 mg | ORAL_TABLET | Freq: Every day | ORAL | Status: DC

## 2013-12-19 MED ORDER — ROPINIROLE HCL 3 MG PO TABS: ORAL_TABLET | ORAL | Status: DC

## 2013-12-19 NOTE — Patient Instructions (Signed)
Continue your current medications  Followup in 1 year sooner if any problems 

## 2013-12-19 NOTE — Progress Notes (Signed)
Pre visit review using our clinic review tool, if applicable. No additional management support is needed unless otherwise documented below in the visit note. 

## 2013-12-19 NOTE — Progress Notes (Signed)
   Subjective:    Patient ID: Casey Kim, male    DOB: 1956/04/15, 58 y.o.   MRN: [id number]  HPI Casey Kim is a 58 year old male married nonsmoker who comes in today for general physical examination  He takes Prilosec 10 mg daily for hyperlipidemia, Requip 3 mg at bedtime for restless leg syndrome, and simvastatin 20 mg and aspirin for hyperlipidemia  He gets routine eye care, dental care, colonoscopy within the last month normal. He was seen here by Dr. Yong Channel for evaluation of rectal bleeding. Exam that time was negative however colonoscopy was indicated therefore done by Dr. Arelia Longest which showed some internal hemorrhoids.  Vaccinations up-to-date except he is due to shingles shot   Review of Systems  Constitutional: Negative.   HENT: Negative.   Eyes: Negative.   Respiratory: Negative.   Cardiovascular: Negative.   Gastrointestinal: Negative.   Genitourinary: Negative.   Musculoskeletal: Negative.   Skin: Negative.   Neurological: Negative.   Psychiatric/Behavioral: Negative.        Objective:   Physical Exam  Nursing note and vitals reviewed. Constitutional: He is oriented to person, place, and time. He appears well-developed and well-nourished.  HENT:  Head: Normocephalic and atraumatic.  Right Ear: External ear normal.  Left Ear: External ear normal.  Nose: Nose normal.  Mouth/Throat: Oropharynx is clear and moist.  Eyes: Conjunctivae and EOM are normal. Pupils are equal, round, and reactive to light.  Neck: Normal range of motion. Neck supple. No JVD present. No tracheal deviation present. No thyromegaly present.  Cardiovascular: Normal rate, regular rhythm, normal heart sounds and intact distal pulses.  Exam reveals no gallop and no friction rub.   No murmur heard. Pulmonary/Chest: Effort normal and breath sounds normal. No stridor. No respiratory distress. He has no wheezes. He has no rales. He exhibits no tenderness.  Abdominal: Soft. Bowel sounds are normal.  He exhibits no distension and no mass. There is no tenderness. There is no rebound and no guarding.  Genitourinary: Penis normal. No penile tenderness.  Prostate and rectum check within the last month therefore not repeated  Musculoskeletal: Normal range of motion. He exhibits no edema and no tenderness.  Lymphadenopathy:    He has no cervical adenopathy.  Neurological: He is alert and oriented to person, place, and time. He has normal reflexes. No cranial nerve deficit. He exhibits normal muscle tone.  Skin: Skin is warm and dry. No rash noted. No erythema. No pallor.  Psychiatric: He has a normal mood and affect. His behavior is normal. Judgment and thought content normal.          Assessment & Plan:  Healthy male  Reflux esophagitis continue Prilosec  Restless leg syndrome continue Requip 3 mg daily  Hyperlipidemia continue simvastatin and aspirin

## 2014-02-01 ENCOUNTER — Telehealth: Payer: Self-pay | Admitting: Family Medicine

## 2014-02-01 DIAGNOSIS — G2581 Restless legs syndrome: Secondary | ICD-10-CM

## 2014-02-01 DIAGNOSIS — E785 Hyperlipidemia, unspecified: Secondary | ICD-10-CM

## 2014-02-01 MED ORDER — ROPINIROLE HCL 3 MG PO TABS: ORAL_TABLET | ORAL | Status: DC

## 2014-02-01 MED ORDER — SIMVASTATIN 20 MG PO TABS: 20.0000 mg | ORAL_TABLET | Freq: Every day | ORAL | Status: DC

## 2014-02-01 NOTE — Telephone Encounter (Signed)
Casey Kim is requesting re-fills on the following: rOPINIRole (REQUIP) 3 MG tablet simvastatin (ZOCOR) 20 MG tablet

## 2015-01-16 ENCOUNTER — Other Ambulatory Visit: Payer: Self-pay | Admitting: Family Medicine

## 2015-02-15 ENCOUNTER — Other Ambulatory Visit: Payer: Self-pay | Admitting: Family Medicine

## 2015-03-04 ENCOUNTER — Telehealth: Payer: Self-pay | Admitting: Family Medicine

## 2015-03-04 NOTE — Telephone Encounter (Signed)
No workins per Dr Todd 

## 2015-03-04 NOTE — Telephone Encounter (Signed)
Pt would like cpx before end of year. Can I create 30 min slot? °

## 2015-03-08 NOTE — Telephone Encounter (Signed)
Left message on vm for pt to call back.  

## 2015-03-08 NOTE — Telephone Encounter (Signed)
Told patient no cpe appts until March.  Made him an appt on 07/29/15 at 1:45pm

## 2015-03-15 ENCOUNTER — Other Ambulatory Visit: Payer: Self-pay | Admitting: Family Medicine

## 2015-04-13 ENCOUNTER — Other Ambulatory Visit: Payer: Self-pay | Admitting: Family Medicine

## 2015-07-23 ENCOUNTER — Other Ambulatory Visit (INDEPENDENT_AMBULATORY_CARE_PROVIDER_SITE_OTHER): Payer: Managed Care, Other (non HMO)

## 2015-07-23 DIAGNOSIS — Z0000 Encounter for general adult medical examination without abnormal findings: Secondary | ICD-10-CM | POA: Diagnosis not present

## 2015-07-23 LAB — LIPID PANEL
Cholesterol: 234 mg/dL — ABNORMAL HIGH (ref 0–200)
HDL: 49.5 mg/dL (ref >39.00)
LDL Cholesterol: 152 mg/dL — ABNORMAL HIGH (ref 0–99)
NonHDL: 184.12
TRIGLYCERIDES: 161 mg/dL — AB (ref 0.0–149.0)
Total CHOL/HDL Ratio: 5
VLDL: 32.2 mg/dL (ref 0.0–40.0)

## 2015-07-23 LAB — POC URINALSYSI DIPSTICK (AUTOMATED)
Bilirubin, UA: NEGATIVE
Blood, UA: NEGATIVE
Glucose, UA: NEGATIVE
Ketones, UA: NEGATIVE
LEUKOCYTES UA: NEGATIVE
Nitrite, UA: NEGATIVE
Spec Grav, UA: 1.02
UROBILINOGEN UA: 1
pH, UA: 7

## 2015-07-23 LAB — BASIC METABOLIC PANEL
BUN: 14 mg/dL (ref 6–23)
CALCIUM: 9.5 mg/dL (ref 8.4–10.5)
CO2: 32 meq/L (ref 19–32)
CREATININE: 1.03 mg/dL (ref 0.40–1.50)
Chloride: 104 mEq/L (ref 96–112)
GFR: 77.87 mL/min (ref >60.00)
Glucose, Bld: 102 mg/dL — ABNORMAL HIGH (ref 70–99)
POTASSIUM: 4.9 meq/L (ref 3.5–5.1)
SODIUM: 141 meq/L (ref 135–145)

## 2015-07-23 LAB — CBC WITH DIFFERENTIAL/PLATELET
BASOS PCT: 1 % (ref 0.0–3.0)
Basophils Absolute: 0 10*3/uL (ref 0.0–0.1)
EOS PCT: 5.1 % — AB (ref 0.0–5.0)
Eosinophils Absolute: 0.2 10*3/uL (ref 0.0–0.7)
HCT: 45.7 % (ref 39.0–52.0)
HEMOGLOBIN: 15.8 g/dL (ref 13.0–17.0)
LYMPHS ABS: 1.4 10*3/uL (ref 0.7–4.0)
Lymphocytes Relative: 35.4 % (ref 12.0–46.0)
MCHC: 34.6 g/dL (ref 30.0–36.0)
MCV: 97.3 fl (ref 78.0–100.0)
MONOS PCT: 11.1 % (ref 3.0–12.0)
Monocytes Absolute: 0.4 10*3/uL (ref 0.1–1.0)
Neutro Abs: 1.9 10*3/uL (ref 1.4–7.7)
Neutrophils Relative %: 47.4 % (ref 43.0–77.0)
Platelets: 218 10*3/uL (ref 150.0–400.0)
RBC: 4.7 Mil/uL (ref 4.22–5.81)
RDW: 13.8 % (ref 11.5–15.5)
WBC: 4 10*3/uL (ref 4.0–10.5)

## 2015-07-23 LAB — HEPATIC FUNCTION PANEL
ALBUMIN: 4.2 g/dL (ref 3.5–5.2)
ALT: 22 U/L (ref 0–53)
AST: 17 U/L (ref 0–37)
Alkaline Phosphatase: 45 U/L (ref 39–117)
Bilirubin, Direct: 0.2 mg/dL (ref 0.0–0.3)
Total Bilirubin: 1 mg/dL (ref 0.2–1.2)
Total Protein: 6.8 g/dL (ref 6.0–8.3)

## 2015-07-23 LAB — PSA: PSA: 0.73 ng/mL (ref 0.10–4.00)

## 2015-07-23 LAB — TSH: TSH: 1.81 u[IU]/mL (ref 0.35–4.50)

## 2015-07-29 ENCOUNTER — Encounter: Payer: Self-pay | Admitting: Family Medicine

## 2015-07-29 ENCOUNTER — Ambulatory Visit (INDEPENDENT_AMBULATORY_CARE_PROVIDER_SITE_OTHER): Payer: Managed Care, Other (non HMO) | Admitting: Family Medicine

## 2015-07-29 VITALS — BP 140/90 | Temp 98.5°F | Ht 68.0 in | Wt 200.0 lb

## 2015-07-29 DIAGNOSIS — N521 Erectile dysfunction due to diseases classified elsewhere: Secondary | ICD-10-CM | POA: Diagnosis not present

## 2015-07-29 DIAGNOSIS — Z0000 Encounter for general adult medical examination without abnormal findings: Secondary | ICD-10-CM

## 2015-07-29 DIAGNOSIS — E785 Hyperlipidemia, unspecified: Secondary | ICD-10-CM | POA: Diagnosis not present

## 2015-07-29 DIAGNOSIS — G2581 Restless legs syndrome: Secondary | ICD-10-CM | POA: Diagnosis not present

## 2015-07-29 DIAGNOSIS — K219 Gastro-esophageal reflux disease without esophagitis: Secondary | ICD-10-CM

## 2015-07-29 MED ORDER — OMEPRAZOLE 10 MG PO CPDR: 10.0000 mg | DELAYED_RELEASE_CAPSULE | Freq: Every day | ORAL | Status: DC

## 2015-07-29 MED ORDER — SILDENAFIL CITRATE 100 MG PO TABS: 50.0000 mg | ORAL_TABLET | Freq: Every day | ORAL | Status: DC | PRN

## 2015-07-29 MED ORDER — ROPINIROLE HCL 3 MG PO TABS: ORAL_TABLET | ORAL | Status: DC

## 2015-07-29 MED ORDER — PREDNISONE 20 MG PO TABS: ORAL_TABLET | ORAL | Status: DC

## 2015-07-29 NOTE — Patient Instructions (Addendum)
Continue current medication  Remember to walk 30 minutes daily,,,,,,,,, avoid carbohydrates  Return in one year for general physical examination sooner if any problems  Viagra 100 mg........... [email or web address]  Prednisone 20 mg............ uses directed.......... if after you finish the prednisone the hip pain persists I would consult Dr. Joni Fears

## 2015-07-29 NOTE — Progress Notes (Signed)
   Subjective:    Patient ID: Casey Kim, male    DOB: 1956-01-08, 60 y.o.   MRN: [id number]  HPI Casey Kim is a 60 year old married male nonsmoker who comes in today for general physical examination because of a history of restless leg syndrome, reflux esophagitis, and hyperlipidemia  He takes Prilosec 10 mg daily for chronic reflux esophagitis  He takes Requip 3 mg daily because history of restless leg syndrome. He uses Viagra 100 mg when necessary for ED. He was on Zocor 20 mg daily and an aspirin for hyperlipidemia. He stopped taking the Zocor about 2 years ago. Total cholesterol 234 HDL 49 and LDL 152 which is borderline  He gets routine eye care, dental care, colonoscopy 2015 was normal.  He was last year and saw Dr. Yong Kim for rectal bleeding. Indeterminate to be internal hemorrhoids.  Vaccinations up-to-date   Review of Systems  Constitutional: Negative.   HENT: Negative.   Eyes: Negative.   Respiratory: Negative.   Cardiovascular: Negative.   Gastrointestinal: Negative.   Endocrine: Negative.   Genitourinary: Negative.   Musculoskeletal: Negative.   Skin: Negative.   Allergic/Immunologic: Negative.   Neurological: Negative.   Hematological: Negative.   Psychiatric/Behavioral: Negative.        Objective:   Physical Exam  Constitutional: He is oriented to person, place, and time. He appears well-developed and well-nourished.  HENT:  Head: Normocephalic and atraumatic.  Right Ear: External ear normal.  Left Ear: External ear normal.  Nose: Nose normal.  Mouth/Throat: Oropharynx is clear and moist.  Bilateral hearing aids  Eyes: Conjunctivae and EOM are normal. Pupils are equal, round, and reactive to light.  Neck: Normal range of motion. Neck supple. No JVD present. No tracheal deviation present. No thyromegaly present.  Cardiovascular: Normal rate, regular rhythm, normal heart sounds and intact distal pulses.  Exam reveals no gallop and no friction rub.   No  murmur heard. No carotid aortic bruits peripheral pulses 2+ and symmetrical  Pulmonary/Chest: Effort normal and breath sounds normal. No stridor. No respiratory distress. He has no wheezes. He has no rales. He exhibits no tenderness.  Abdominal: Soft. Bowel sounds are normal. He exhibits no distension and no mass. There is no tenderness. There is no rebound and no guarding.  Genitourinary: Rectum normal, prostate normal and penis normal. Guaiac negative stool. No penile tenderness.  Musculoskeletal: Normal range of motion. He exhibits no edema or tenderness.  Lymphadenopathy:    He has no cervical adenopathy.  Neurological: He is alert and oriented to person, place, and time. He has normal reflexes. No cranial nerve deficit. He exhibits normal muscle tone.  Skin: Skin is warm and dry. No rash noted. No erythema. No pallor.  Psychiatric: He has a normal mood and affect. His behavior is normal. Judgment and thought content normal.  Nursing note and vitals reviewed.         Assessment & Plan:  Healthy male  Reflux esophagitis,,,,,,, continue Prilosec 10 mg daily  Mild ED,,,,,,,, refill Viagra  Restless leg syndrome,,,,,,,, continue Requip

## 2015-07-29 NOTE — Progress Notes (Signed)
Pre visit review using our clinic review tool, if applicable. No additional management support is needed unless otherwise documented below in the visit note. 

## 2015-08-13 ENCOUNTER — Other Ambulatory Visit: Payer: Self-pay | Admitting: Family Medicine

## 2016-06-08 ENCOUNTER — Telehealth: Payer: Self-pay | Admitting: Family Medicine

## 2016-06-08 ENCOUNTER — Telehealth: Payer: Self-pay | Admitting: Emergency Medicine

## 2016-06-08 NOTE — Telephone Encounter (Signed)
Spoke with pt and he stated that after he returned from a fishing trip he went to the doctor and his blood pressure was 160/ 103. From then he would [facility] and they would change from time to time ranging from 124-131/ 87-90. Pt does not feel light headed or dizzy but would like a call back to see what Dr. Sherren Mocha thinks.

## 2016-06-08 NOTE — Telephone Encounter (Signed)
Spoke with Casey Kim and gave him Dr. Sherren Mocha recommendations which were to purchase a [employer] Pump Up ditigal monitor and to take his blood pressure every am. Record am blood pressure and follow up in one month.

## 2016-06-08 NOTE — Telephone Encounter (Signed)
Pt would like a call back to discuss some ongoing bp issues. Pt has upcoming cpe in April, but wants a callback.

## 2016-07-06 ENCOUNTER — Telehealth: Payer: Self-pay | Admitting: Family Medicine

## 2016-07-06 MED ORDER — ACYCLOVIR 400 MG PO TABS: 400.0000 mg | ORAL_TABLET | Freq: Three times a day (TID) | ORAL | 4 refills | Status: DC

## 2016-07-06 NOTE — Telephone Encounter (Signed)
Pt state that he is having fever blisters popping up on his lips and would like to see if Dr. Sherren Mocha would call something in for him.   Pharm:  [facility]    Pt decline appt on Wednesday

## 2016-07-06 NOTE — Telephone Encounter (Signed)
Left a message on identified voicemail informing the pt that a prescription has been sent to the pharmacy.  Advised a call back if any questions.

## 2016-07-12 ENCOUNTER — Emergency Department (HOSPITAL_BASED_OUTPATIENT_CLINIC_OR_DEPARTMENT_OTHER)
Admission: EM | Admit: 2016-07-12 | Discharge: 2016-07-12 | Disposition: A | Discharge status: Home / Self Care | Payer: Managed Care, Other (non HMO) | Attending: Emergency Medicine | Admitting: Emergency Medicine

## 2016-07-12 ENCOUNTER — Encounter (HOSPITAL_BASED_OUTPATIENT_CLINIC_OR_DEPARTMENT_OTHER): Payer: Self-pay | Admitting: *Deleted

## 2016-07-12 DIAGNOSIS — S61211A Laceration without foreign body of left index finger without damage to nail, initial encounter: Secondary | ICD-10-CM | POA: Diagnosis not present

## 2016-07-12 DIAGNOSIS — W260XXA Contact with knife, initial encounter: Secondary | ICD-10-CM | POA: Insufficient documentation

## 2016-07-12 DIAGNOSIS — Z7982 Long term (current) use of aspirin: Secondary | ICD-10-CM | POA: Diagnosis not present

## 2016-07-12 DIAGNOSIS — Z79899 Other long term (current) drug therapy: Secondary | ICD-10-CM | POA: Diagnosis not present

## 2016-07-12 DIAGNOSIS — Y939 Activity, unspecified: Secondary | ICD-10-CM | POA: Insufficient documentation

## 2016-07-12 DIAGNOSIS — Y999 Unspecified external cause status: Secondary | ICD-10-CM | POA: Diagnosis not present

## 2016-07-12 DIAGNOSIS — Y929 Unspecified place or not applicable: Secondary | ICD-10-CM | POA: Insufficient documentation

## 2016-07-12 DIAGNOSIS — Z87891 Personal history of nicotine dependence: Secondary | ICD-10-CM | POA: Diagnosis not present

## 2016-07-12 DIAGNOSIS — S6992XA Unspecified injury of left wrist, hand and finger(s), initial encounter: Secondary | ICD-10-CM | POA: Diagnosis present

## 2016-07-12 MED ORDER — LIDOCAINE-EPINEPHRINE 2 %-1:100000 IJ SOLN
10.0000 mL | Freq: Once | INTRAMUSCULAR | Status: AC
  Administered 2016-07-12: 10 mL
  Filled 2016-07-12: qty 1

## 2016-07-12 NOTE — ED Triage Notes (Signed)
Finger laceration to left pointer finger with kitchen knife.  Bleeding controlled in triage.

## 2016-07-12 NOTE — ED Provider Notes (Addendum)
[facility] Provider Note   CSN: [id number] Arrival date & time: 07/12/16  1839   By signing my name below, I, Eunice Blase, attest that this documentation has been prepared under the direction and in the presence of Carmon Sails, PA-C. Electronically signed, Eunice Blase, ED Scribe. 07/12/16. 8:20 PM.   History   Chief Complaint Chief Complaint  Patient presents with  . Extremity Laceration   The history is provided by the patient and medical records. No language interpreter was used.    HPI Comments: GEDALIA MCMILLON is a 61 y.o. male who presents to the Emergency Department complaining of a left index laceration that he sustained today. Pt states he cut the finger with a kitchen knife accidentally. Bleeding controlled in triage. Pt denies blood thinner use and numbness. Tetanus reportedly UTD. No h/o bleeding or clotting disorders, no h/o DM or tobacco abuse.  Past Medical History:  Diagnosis Date  . Allergic rhinitis   . Diverticulosis 1988  . Hearing loss   . Hyperlipidemia   . Restless leg syndrome     Patient Active Problem List   Diagnosis Date Noted  . Routine general medical examination at a health care facility 07/29/2015  . Internal hemorrhoids with bleeding 11/28/2013  . Rectal bleeding 11/20/2013  . ED (erectile dysfunction) 09/22/2011  . Hyperlipidemia 04/26/2008  . RESTLESS LEG SYNDROME 12/13/2006  . RHINITIS, ALLERGIC, DUE TO POLLEN 12/13/2006    Past Surgical History:  Procedure Laterality Date  . COLONOSCOPY    . MENISCUS REPAIR Right   . PARTIAL COLECTOMY  1998   for severe diverticulitis   . Rhinoseptoplasty    . TONSILLECTOMY         Home Medications    Prior to Admission medications   Medication Sig Start Date End Date Taking? Authorizing Provider  acyclovir (ZOVIRAX) 400 MG tablet Take 1 tablet (400 mg total) by mouth 3 (three) times daily. 07/06/16   Dorena Cookey, MD  aspirin 81 MG tablet Take 81 mg by mouth daily.     Historical Provider, MD  omeprazole (PRILOSEC) 10 MG capsule Take 1 capsule (10 mg total) by mouth daily. 07/29/15   Dorena Cookey, MD  predniSONE (DELTASONE) 20 MG tablet 2 tabs x 3 days, 1 tab x 3 days, 1/2 tab x 3 days, 1/2 tab M,W,F x 2 weeks 07/29/15   Dorena Cookey, MD  rOPINIRole (REQUIP) 3 MG tablet TAKE 1 TABLET BY MOUTH AT BEDTIME. 08/13/15   Dorena Cookey, MD  sildenafil (VIAGRA) 100 MG tablet Take 0.5-1 tablets (50-100 mg total) by mouth daily as needed for erectile dysfunction. 07/29/15   Dorena Cookey, MD  simvastatin (ZOCOR) 20 MG tablet Take 1 tablet (20 mg total) by mouth daily. Patient not taking: Reported on 07/29/2015 02/01/14   Dorena Cookey, MD  Wheat Dextrin [facility]) POWD Take 2 tablespoons in liquid by mouth daily 11/21/13   Gatha Mayer, MD    Family History Family History  Problem Relation Age of Onset  . Diabetes Father   . Heart failure Father   . Lung cancer Mother   . Diabetes Brother     Social History Social History  Substance Use Topics  . Smoking status: Never Smoker  . Smokeless tobacco: Former Systems developer  . Alcohol use Yes     Comment: 2-3 beers a day     Allergies   Fish oil   Review of Systems Review of Systems  Constitutional: Negative for  chills, diaphoresis, fatigue and fever.  Gastrointestinal: Negative for diarrhea, nausea and vomiting.  Musculoskeletal: Negative for arthralgias, joint swelling and myalgias.  Skin: Positive for wound.  Allergic/Immunologic: Negative for immunocompromised state.  Neurological: Negative for light-headedness and numbness.  Hematological: Does not bruise/bleed easily.  Psychiatric/Behavioral: Negative for confusion.     Physical Exam Updated Vital Signs BP 131/89 (BP Location: Right Arm)   Pulse 72   Temp 98 F (36.7 C) (Oral)   Resp 20   Ht 5' 9.5" (1.765 m)   Wt 192 lb (87.1 kg)   SpO2 98%   BMI 27.95 kg/m   Physical Exam  Constitutional: He is oriented to person, place, and time. He  appears well-developed and well-nourished. No distress.  HENT:  Head: Normocephalic and atraumatic.  Eyes: Conjunctivae and EOM are normal. No scleral icterus.  Neck: Normal range of motion.  Cardiovascular: Normal rate, regular rhythm, normal heart sounds and intact distal pulses.   No murmur heard. Pulmonary/Chest: Effort normal and breath sounds normal. He has no wheezes.  Musculoskeletal: Normal range of motion. He exhibits tenderness. He exhibits no edema or deformity.       Hands: 3 cm laceration to volar aspect of left index digit between DIP and PIP with appropriate tenderness, no active bleeding.  No bony tenderness over left index MCP, PIP or DIP.  Full active flexion and extension of left index digit at MCP, PIP and DIP.  Patient able to make closed fist.  Neurological: He is alert and oriented to person, place, and time. No sensory deficit.  LEFT HAND 5/5 strength with finger abduction Good pincer and hand grip bilaterally.  Sensation to light touch and 2 point discrimination intact in median, ulnar and radial nerve distribution.  Skin: Skin is warm and dry. Capillary refill takes less than 2 seconds. Laceration noted.  Psychiatric: He has a normal mood and affect. His behavior is normal. Judgment and thought content normal.  Nursing note and vitals reviewed.    ED Treatments / Results  DIAGNOSTIC STUDIES: Oxygen Saturation is 98% on RA, normal by my interpretation.    COORDINATION OF CARE: 8:17 PM Discussed treatment plan with pt at bedside and pt agreed to plan. Pt prepared for laceration repair.  Labs (all labs ordered are listed, but only abnormal results are displayed) Labs Reviewed - No data to display  EKG  EKG Interpretation None       Radiology No results found.  Procedures Procedures (including critical care time)  LACERATION REPAIR Performed by: Kinnie Feil Authorized by: Kinnie Feil Consent: Verbal consent obtained. Risks and  benefits: risks, benefits and alternatives were discussed Consent given by: patient Patient identity confirmed: provided demographic data Prepped and Draped in normal sterile fashion Wound explored  Laceration Location: left index finger  Laceration Length: 3 cm  No Foreign Bodies seen or palpated  Anesthesia: local infiltration  Local anesthetic: lidocaine 2% with epinephrine  Anesthetic total: 3 ml  Irrigation method: syringe Amount of cleaning: standard  Skin closure: primary closure  Number of sutures: 3   Technique: simple interrupted   Patient tolerance: Patient tolerated the procedure well with no immediate complications.  Medications Ordered in ED Medications  lidocaine-EPINEPHrine (XYLOCAINE W/EPI) 2 %-1:100000 (with pres) injection 10 mL (10 mLs Infiltration Given by Other 07/12/16 2030)     Initial Impression / Assessment and Plan / ED Course  I have reviewed the triage vital signs and the nursing notes.  Pertinent labs & imaging results  that were available during my care of the patient were reviewed by me and considered in my medical decision making (see chart for details).   Pressure irrigation performed, betadine swabs x 2 used to sterilize prior to laceration repair. Wound explored and base of wound visualized in a bloodless field without evidence of foreign body.  Neurovascularly intact.  Laceration occurred < 8 hours prior to repair which was well tolerated. Patient declined tdap today, states it is updated.  Pt has no comorbidities to effect normal wound healing. Pt discharged without antibiotics.  Discussed suture home care with patient and answered questions. Pt to follow-up for wound check and suture removal in 8-10 days; they are to return to the ED sooner for signs of infection. Pt is hemodynamically stable with no complaints prior to dc.    Final Clinical Impressions(s) / ED Diagnoses   Final diagnoses:  Laceration of left index finger without  foreign body without damage to nail, initial encounter    New Prescriptions Discharge Medication List as of 07/12/2016  8:48 PM       Kinnie Feil, PA-C 07/12/16 2105    Malvin Johns, MD 07/12/16 [place], PA-C 07/20/16 1619    Malvin Johns, MD 07/23/16 1204

## 2016-07-12 NOTE — ED Notes (Signed)
Suture cart is outside patient room

## 2016-07-12 NOTE — Discharge Instructions (Signed)
Please keep laceration dry for the next 48 hours to allow skin to close.  After two days you may rinse your laceration with clean water and soap to keep it clean.  Follow up with primary care provider to get sutures removed in 8-10 days. Monitor for signs of infection including swelling, redness, discharge, fever.  Return to the emergency department if you notice numbness or tingling.

## 2016-07-27 ENCOUNTER — Other Ambulatory Visit: Payer: Managed Care, Other (non HMO)

## 2016-08-03 ENCOUNTER — Encounter: Payer: Managed Care, Other (non HMO) | Admitting: Family Medicine

## 2016-08-04 ENCOUNTER — Other Ambulatory Visit: Payer: Self-pay | Admitting: Family Medicine

## 2016-08-04 DIAGNOSIS — K219 Gastro-esophageal reflux disease without esophagitis: Secondary | ICD-10-CM

## 2016-08-12 ENCOUNTER — Ambulatory Visit (INDEPENDENT_AMBULATORY_CARE_PROVIDER_SITE_OTHER): Payer: Managed Care, Other (non HMO) | Admitting: Family Medicine

## 2016-08-12 ENCOUNTER — Encounter: Payer: Self-pay | Admitting: Family Medicine

## 2016-08-12 VITALS — BP 136/82 | Temp 98.7°F | Ht 68.5 in | Wt 194.0 lb

## 2016-08-12 DIAGNOSIS — N521 Erectile dysfunction due to diseases classified elsewhere: Secondary | ICD-10-CM

## 2016-08-12 DIAGNOSIS — E7800 Pure hypercholesterolemia, unspecified: Secondary | ICD-10-CM

## 2016-08-12 DIAGNOSIS — Z0000 Encounter for general adult medical examination without abnormal findings: Secondary | ICD-10-CM

## 2016-08-12 DIAGNOSIS — K219 Gastro-esophageal reflux disease without esophagitis: Secondary | ICD-10-CM

## 2016-08-12 DIAGNOSIS — Z136 Encounter for screening for cardiovascular disorders: Secondary | ICD-10-CM

## 2016-08-12 DIAGNOSIS — E785 Hyperlipidemia, unspecified: Secondary | ICD-10-CM | POA: Diagnosis not present

## 2016-08-12 DIAGNOSIS — G2581 Restless legs syndrome: Secondary | ICD-10-CM

## 2016-08-12 DIAGNOSIS — Z23 Encounter for immunization: Secondary | ICD-10-CM | POA: Diagnosis not present

## 2016-08-12 LAB — TSH: TSH: 1.81 u[IU]/mL (ref 0.35–4.50)

## 2016-08-12 LAB — BASIC METABOLIC PANEL
BUN: 12 mg/dL (ref 6–23)
CHLORIDE: 106 meq/L (ref 96–112)
CO2: 27 mEq/L (ref 19–32)
Calcium: 9.4 mg/dL (ref 8.4–10.5)
Creatinine, Ser: 0.85 mg/dL (ref 0.40–1.50)
GFR: 96.86 mL/min (ref >60.00)
Glucose, Bld: 101 mg/dL — ABNORMAL HIGH (ref 70–99)
POTASSIUM: 4.2 meq/L (ref 3.5–5.1)
SODIUM: 137 meq/L (ref 135–145)

## 2016-08-12 LAB — POCT URINALYSIS DIPSTICK
Bilirubin, UA: NEGATIVE
Blood, UA: NEGATIVE
Glucose, UA: NEGATIVE
Ketones, UA: NEGATIVE
LEUKOCYTES UA: NEGATIVE — AB
Nitrite, UA: NEGATIVE
PROTEIN UA: NEGATIVE
Spec Grav, UA: 1.025 (ref 1.010–1.025)
UROBILINOGEN UA: 0.2 U/dL
pH, UA: 6 (ref 5.0–8.0)

## 2016-08-12 LAB — CBC WITH DIFFERENTIAL/PLATELET
Basophils Absolute: 0 10*3/uL (ref 0.0–0.1)
Basophils Relative: 1.2 % (ref 0.0–3.0)
EOS ABS: 0.1 10*3/uL (ref 0.0–0.7)
EOS PCT: 2.9 % (ref 0.0–5.0)
HEMATOCRIT: 47 % (ref 39.0–52.0)
HEMOGLOBIN: 16.1 g/dL (ref 13.0–17.0)
LYMPHS PCT: 17.6 % (ref 12.0–46.0)
Lymphs Abs: 0.7 10*3/uL (ref 0.7–4.0)
MCHC: 34.4 g/dL (ref 30.0–36.0)
MCV: 99.4 fl (ref 78.0–100.0)
MONO ABS: 0.5 10*3/uL (ref 0.1–1.0)
Monocytes Relative: 13.5 % — ABNORMAL HIGH (ref 3.0–12.0)
Neutro Abs: 2.5 10*3/uL (ref 1.4–7.7)
Neutrophils Relative %: 64.8 % (ref 43.0–77.0)
Platelets: 201 10*3/uL (ref 150.0–400.0)
RBC: 4.72 Mil/uL (ref 4.22–5.81)
RDW: 14 % (ref 11.5–15.5)
WBC: 3.8 10*3/uL — AB (ref 4.0–10.5)

## 2016-08-12 LAB — LIPID PANEL
CHOL/HDL RATIO: 4
CHOLESTEROL: 211 mg/dL — AB (ref 0–200)
HDL: 47 mg/dL (ref >39.00)
LDL Cholesterol: 135 mg/dL — ABNORMAL HIGH (ref 0–99)
NonHDL: 164.19
TRIGLYCERIDES: 146 mg/dL (ref 0.0–149.0)
VLDL: 29.2 mg/dL (ref 0.0–40.0)

## 2016-08-12 LAB — HEPATIC FUNCTION PANEL
ALK PHOS: 49 U/L (ref 39–117)
ALT: 30 U/L (ref 0–53)
AST: 20 U/L (ref 0–37)
Albumin: 4.4 g/dL (ref 3.5–5.2)
BILIRUBIN DIRECT: 0.2 mg/dL (ref 0.0–0.3)
Total Bilirubin: 0.8 mg/dL (ref 0.2–1.2)
Total Protein: 7.1 g/dL (ref 6.0–8.3)

## 2016-08-12 LAB — PSA: PSA: 0.66 ng/mL (ref 0.10–4.00)

## 2016-08-12 MED ORDER — SILDENAFIL CITRATE 20 MG PO TABS: ORAL_TABLET | ORAL | 11 refills | Status: DC

## 2016-08-12 MED ORDER — ACYCLOVIR 400 MG PO TABS: 400.0000 mg | ORAL_TABLET | Freq: Three times a day (TID) | ORAL | 4 refills | Status: DC

## 2016-08-12 MED ORDER — SILDENAFIL CITRATE 100 MG PO TABS: 50.0000 mg | ORAL_TABLET | Freq: Every day | ORAL | 11 refills | Status: DC | PRN

## 2016-08-12 MED ORDER — OMEPRAZOLE 10 MG PO CPDR: 10.0000 mg | DELAYED_RELEASE_CAPSULE | Freq: Every day | ORAL | 4 refills | Status: AC

## 2016-08-12 MED ORDER — SIMVASTATIN 20 MG PO TABS
20.0000 mg | ORAL_TABLET | Freq: Every day | ORAL | 4 refills | Status: DC
Start: 2016-08-12 — End: 2018-01-05

## 2016-08-12 MED ORDER — HYDROCODONE-HOMATROPINE 5-1.5 MG/5ML PO SYRP: 5.0000 mL | ORAL_SOLUTION | Freq: Three times a day (TID) | ORAL | 0 refills | Status: DC | PRN

## 2016-08-12 MED ORDER — ROPINIROLE HCL 4 MG PO TABS: 4.0000 mg | ORAL_TABLET | Freq: Every day | ORAL | 4 refills | Status: DC

## 2016-08-12 NOTE — Progress Notes (Signed)
Casey Kim is a 61 year old married male nonsmoker who comes in today for general physical examination  He gets routine eye care, dental care, colonoscopy 2015 was normal except for some internal hemorrhoids. He was told he could have and banded however he's relatively asymptomatic. [name] of Bright Red Bleeding Every Couple Months. The Pattern Hasn't Changed.  Vaccinations Up-To-Date except He Is Due the New Shingles Vaccine. We'll Give Him His First Dose Today.  He Takes Acyclovir 4 Mg 3 Times A Day When Necessary for HSV One  He Takes an Aspirin Tablet and Prilosec Daily. He's Got a History of Reflux Esophagitis.  We Send Her to Neurology Because of Restless Leg Syndrome. They Started Him on Requip 3 Mg Daily. He States Now It's about 50% Effective.  He Takes Zocor and Aspirin for Hyperlipidemia. Check Labs Today.  He Uses Viagra When Necessary for Mild ED.  Family History for High Blood Pressure Is None. He Brings up Readings in Today with Him He's Been Monitoring His Blood Pressure for about 2 Months. They're All Slightly Elevated However Will Be Checked His Digital Automatic Machine Is Not Functioning Properly. BP Right Arm Sitting Position 140/90 Left Arm Sitting Position 146/90.  14 Point Review of Systems Reviewed and Otherwise Negative  EKG Was Done. EKG Unchanged and Normal  BP 136/82 (BP Location: Left Arm, Patient Position: Sitting, Cuff Size: Normal)   Temp 98.7 F (37.1 C) (Oral)   Ht 5' 8.5" (1.74 m)   Wt 194 lb (88 kg)   BMI 29.07 kg/m  Examination of the HEENT were negative neck supple thyroid is not enlarged no carotid bruits cardiopulmonary exam normal abdominal exam normal except for scar lower abdomen. He had a partial colectomy in 1988 for severe diverticulitis. Genitalia normal circumcised male rectum normal stool guaiac-negative prostate normal extremity is normal skin normal peripheral pulses normal  #1 hyperlipidemia........ check labs  History of  occasional HSV-1,,,,, refill acyclovir  #3 reflux esophagitis,,,,,,,,, continue Prilosec  Restless leg syndrome,,,,,,,,,,,,, increase Requip  #5 mild ED,,,,,,,,,, refill Viagra  #6 elevated blood pressure............ BP check with new device follow-up in 2 weeks with data and machine  #7 status post partial colectomy for severe diverticulosis

## 2016-08-12 NOTE — Patient Instructions (Signed)
Do not take any over-the-counter cough and cold preparations for your cold  Drink lots of water  Tylenol 2 tabs 3 times daily when necessary, Hydromet 1/2-1 teaspoon 3 times daily when necessary for cough and cold  Return the automated device,,,,,,,,,,,, get a pump up on Ron digital blood pressure cuff and check your blood pressure in the morning in the evening. Return in the first week in May for follow-up  Increase the Requip to 4 mg daily  Labs today..........Marland Kitchen we will call you if there is anything abnormal.

## 2016-08-12 NOTE — Progress Notes (Signed)
Pre visit review using our clinic review tool, if applicable. No additional management support is needed unless otherwise documented below in the visit note. 

## 2016-08-21 ENCOUNTER — Encounter: Payer: Self-pay | Admitting: Physician Assistant

## 2016-08-21 ENCOUNTER — Ambulatory Visit (INDEPENDENT_AMBULATORY_CARE_PROVIDER_SITE_OTHER): Payer: Managed Care, Other (non HMO) | Admitting: Physician Assistant

## 2016-08-21 VITALS — BP 138/88 | HR 73 | Temp 98.5°F | Ht 68.5 in | Wt 197.4 lb

## 2016-08-21 DIAGNOSIS — J0100 Acute maxillary sinusitis, unspecified: Secondary | ICD-10-CM | POA: Diagnosis not present

## 2016-08-21 MED ORDER — DOXYCYCLINE HYCLATE 100 MG PO TABS
100.0000 mg | ORAL_TABLET | Freq: Two times a day (BID) | ORAL | 0 refills | Status: DC
Start: 2016-08-21 — End: 2016-09-14

## 2016-08-21 MED ORDER — BENZONATATE 100 MG PO CAPS: 100.0000 mg | ORAL_CAPSULE | Freq: Two times a day (BID) | ORAL | 0 refills | Status: DC | PRN

## 2016-08-21 NOTE — Patient Instructions (Signed)
You have a sinus infection. Take medicine as prescribed: Doxycycline (antibiotic) and Tessalon Perles (cough medicine capsules). You may continue to take the cough syrup at night. Push fluids and plenty of rest. Nasal saline irrigation or neti pot to help drain sinuses. Please let us know if fever >101.5, trouble opening/closing mouth, difficulty swallowing, or worsening instead of improving as expected.

## 2016-08-21 NOTE — Progress Notes (Signed)
Pre visit review using our clinic review tool, if applicable. No additional management support is needed unless otherwise documented below in the visit note. 

## 2016-08-21 NOTE — Progress Notes (Signed)
Casey Kim is a 61 y.o. male here for cough, nasal and chest congestion.  I acted as a Education administrator for [employer], PA-C Anselmo Pickler, LPN  History of Present Illness:   Chief Complaint  Patient presents with  . Cough     x 2 weeks, expectorating green  . Nasal Congestion  . Chest congestion  . Headache    HPI  Patient reports that he went to his primary care doctor about 2 weeks ago and was told that he had a cold. Was given a prescription of Hycodan that he has been using small amounts of prior to going to bed, as it makes him sleepy. He has had continued cough and now has sinus pressure that is causing intermittent dull headaches. He is bringing up green mucus. His appetite has diminished, but he is trying to stay well-hydrated. Denies v/d but did have a little bit of nausea yesterday. He feels tired. Denies chest pain or SOB, ear pain, or sore throat. Denies sick contacts.  PMHx, SurgHx, SocialHx, Medications, and Allergies were reviewed in the Visit Navigator and updated as appropriate.  Current Medications:   Current Outpatient Prescriptions:  .  acyclovir (ZOVIRAX) 400 MG tablet, Take 1 tablet (400 mg total) by mouth 3 (three) times daily., Disp: 30 tablet, Rfl: 4 .  aspirin 81 MG tablet, Take 81 mg by mouth daily., Disp: , Rfl:  .  Barberry-Oreg Grape-Goldenseal (BERBERINE COMPLEX PO), Take 1,000 mg by mouth daily., Disp: , Rfl:  .  HYDROcodone-homatropine (HYCODAN) 5-1.5 MG/5ML syrup, Take 5 mLs by mouth every 8 (eight) hours as needed., Disp: 240 mL, Rfl: 0 .  omeprazole (PRILOSEC) 10 MG capsule, Take 1 capsule (10 mg total) by mouth daily., Disp: 100 capsule, Rfl: 4 .  rOPINIRole (REQUIP) 4 MG tablet, Take 1 tablet (4 mg total) by mouth at bedtime., Disp: 100 tablet, Rfl: 4 .  simvastatin (ZOCOR) 20 MG tablet, Take 1 tablet (20 mg total) by mouth daily., Disp: 90 tablet, Rfl: 4 .  Wheat Dextrin (BENEFIBER) POWD, Take 2 tablespoons in liquid by mouth daily, Disp: 529  g, Rfl: 0 .  benzonatate (TESSALON) 100 MG capsule, Take 1 capsule (100 mg total) by mouth 2 (two) times daily as needed for cough., Disp: 20 capsule, Rfl: 0 .  doxycycline (VIBRA-TABS) 100 MG tablet, Take 1 tablet (100 mg total) by mouth 2 (two) times daily., Disp: 20 tablet, Rfl: 0 .  sildenafil (REVATIO) 20 MG tablet, Take 1 to 2 tabs 2 - 3 hours before sex (Patient not taking: Reported on 08/21/2016), Disp: 30 tablet, Rfl: 11 .  sildenafil (VIAGRA) 100 MG tablet, Take 0.5-1 tablets (50-100 mg total) by mouth daily as needed for erectile dysfunction. (Patient not taking: Reported on 08/21/2016), Disp: 20 tablet, Rfl: 11   Review of Systems:   Review of Systems  Constitutional: Positive for malaise/fatigue.  HENT: Positive for congestion, sinus pain and sore throat.   Respiratory: Positive for cough, sputum production and wheezing.   Gastrointestinal: Positive for nausea.       Yesterday afternoon, none now  Neurological: Positive for headaches.    Vitals:   Vitals:   08/21/16 1134  BP: 138/88  Pulse: 73  Temp: 98.5 F (36.9 C)  TempSrc: Oral  SpO2: 96%  Weight: 197 lb 6.1 oz (89.5 kg)  Height: 5' 8.5" (1.74 m)     Body mass index is 29.58 kg/m.  Physical Exam:   Physical Exam  Constitutional: He appears well-developed. He  is cooperative.  Non-toxic appearance. He does not have a sickly appearance. He does not appear ill. No distress.  HENT:  Head: Normocephalic and atraumatic.  Right Ear: Tympanic membrane, external ear and ear canal normal. Tympanic membrane is not erythematous, not retracted and not bulging.  Left Ear: Tympanic membrane, external ear and ear canal normal. Tympanic membrane is not erythematous, not retracted and not bulging.  Nose: Right sinus exhibits maxillary sinus tenderness. Right sinus exhibits no frontal sinus tenderness. Left sinus exhibits maxillary sinus tenderness. Left sinus exhibits no frontal sinus tenderness.  Mouth/Throat: Uvula is midline.  Posterior oropharyngeal erythema present. No posterior oropharyngeal edema. Tonsils are 1+ on the right. Tonsils are 1+ on the left.  Eyes: Conjunctivae and lids are normal.  Neck: Trachea normal.  Cardiovascular: Normal rate, regular rhythm, S1 normal, S2 normal and normal heart sounds.   Pulmonary/Chest: Effort normal and breath sounds normal. He has no decreased breath sounds. He has no wheezes. He has no rhonchi. He has no rales.  Lymphadenopathy:    He has no cervical adenopathy.  Neurological: He is alert.  Skin: Skin is warm, dry and intact.  Psychiatric: He has a normal mood and affect. His speech is normal and behavior is normal.  Nursing note and vitals reviewed.     Assessment and Plan:    Casey Kim was seen today for cough, nasal congestion, chest congestion and headache.  Diagnoses and all orders for this visit:  Acute non-recurrent maxillary sinusitis  Other orders -     doxycycline (VIBRA-TABS) 100 MG tablet; Take 1 tablet (100 mg total) by mouth 2 (two) times daily. -     benzonatate (TESSALON) 100 MG capsule; Take 1 capsule (100 mg total) by mouth 2 (two) times daily as needed for cough.   Will treat patient for bacterial sinusitis given symptom duration. Tessalon perles during the day. May continue Hycodan at night. Doxycycline as prescribed. Advised patient to follow-up with PCP if lack of improvement despite treatment, or if he develops worsening symptoms such as fever or SOB. Patient is agreeable to plan.  . Reviewed expectations re: course of current medical issues. . Discussed self-management of symptoms. . Outlined signs and symptoms indicating need for more acute intervention. . Patient verbalized understanding and all questions were answered. . See orders for this visit as documented in the electronic medical record. . Patient received an After-Visit Summary.  CMA or LPN served as scribe during this visit. History, Physical, and Plan performed by medical  provider. Documentation and orders reviewed and attested to.  Inda Coke, PA-C

## 2016-09-14 ENCOUNTER — Encounter: Payer: Self-pay | Admitting: Family Medicine

## 2016-09-14 ENCOUNTER — Ambulatory Visit (INDEPENDENT_AMBULATORY_CARE_PROVIDER_SITE_OTHER): Payer: Managed Care, Other (non HMO) | Admitting: Family Medicine

## 2016-09-14 VITALS — BP 116/80 | Temp 98.7°F | Ht 68.5 in | Wt 195.0 lb

## 2016-09-14 DIAGNOSIS — M25521 Pain in right elbow: Secondary | ICD-10-CM | POA: Diagnosis not present

## 2016-09-14 DIAGNOSIS — M7701 Medial epicondylitis, right elbow: Secondary | ICD-10-CM | POA: Insufficient documentation

## 2016-09-14 NOTE — Progress Notes (Signed)
Casey Kim is a 61 year old married male nonsmoker comes in today for follow-up of elevated blood pressure  We saw him a month ago for general physical examination. That time his blood pressure was elevated. Family history negative for hypertension. He had a viral infection been on some medication.  BP checks at home are all normal area BP here today 116/80  He's having pain in the medial portion of his right elbow. He can't play golf at hurts so much. He one time he saw the folks at [facility]. He had it injected and the pain went away.  BP 116/80 (BP Location: Left Arm)   Temp 98.7 F (37.1 C) (Oral)   Ht 5' 8.5" (1.74 m)   Wt 195 lb (88.5 kg)   BMI 29.22 kg/m  General well-developed well-nourished male no acute distress BP right arm sitting position 116/80  Tenderness medial condyle left elbow  #1 normal pressure...Marland KitchenMarland KitchenMarland Kitchen BP check weekly at home to be sure it stays normal  #2 pain right elbow .......Marland Kitchen refer to Dr. Luther Kim sports medicine

## 2016-09-14 NOTE — Patient Instructions (Signed)
Check your blood pressure weekly to be sure it stays normal  Blood pressure goal 135/85 or less  I put in a consult request for you to see the sports medicine folks for evaluation of your pain in the right elbow

## 2016-09-15 ENCOUNTER — Encounter: Payer: Self-pay | Admitting: Sports Medicine

## 2016-09-15 ENCOUNTER — Ambulatory Visit: Payer: Self-pay

## 2016-09-15 ENCOUNTER — Ambulatory Visit (INDEPENDENT_AMBULATORY_CARE_PROVIDER_SITE_OTHER): Payer: Managed Care, Other (non HMO) | Admitting: Sports Medicine

## 2016-09-15 ENCOUNTER — Ambulatory Visit (INDEPENDENT_AMBULATORY_CARE_PROVIDER_SITE_OTHER): Payer: Managed Care, Other (non HMO)

## 2016-09-15 VITALS — BP 130/80 | HR 85 | Ht 68.5 in | Wt 195.2 lb

## 2016-09-15 DIAGNOSIS — M7701 Medial epicondylitis, right elbow: Secondary | ICD-10-CM | POA: Diagnosis not present

## 2016-09-15 DIAGNOSIS — M7711 Lateral epicondylitis, right elbow: Secondary | ICD-10-CM

## 2016-09-15 DIAGNOSIS — M25511 Pain in right shoulder: Secondary | ICD-10-CM | POA: Diagnosis not present

## 2016-09-15 DIAGNOSIS — S46211A Strain of muscle, fascia and tendon of other parts of biceps, right arm, initial encounter: Secondary | ICD-10-CM | POA: Diagnosis not present

## 2016-09-15 DIAGNOSIS — M7550 Bursitis of unspecified shoulder: Secondary | ICD-10-CM | POA: Diagnosis not present

## 2016-09-15 DIAGNOSIS — M25521 Pain in right elbow: Secondary | ICD-10-CM

## 2016-09-15 DIAGNOSIS — G8929 Other chronic pain: Secondary | ICD-10-CM

## 2016-09-15 MED ORDER — NITROGLYCERIN 0.2 MG/HR TD PT24: MEDICATED_PATCH | TRANSDERMAL | 1 refills | Status: DC

## 2016-09-15 NOTE — Patient Instructions (Signed)
Please perform the exercise program that Jeneen Rinks has prepared for you and gone over in detail on a daily basis.  In addition to the handout you were provided you can access your program through: [email or web address]   Your unique program code is: [email or web address]

## 2016-09-15 NOTE — Progress Notes (Addendum)
OFFICE VISIT NOTE Casey Kim. Casey Kim, [facility]  Casey Kim - 61 y.o. male MRN [id number]  Date of birth: June 14, 1955  Visit Date: 09/15/2016  PCP: Dorena Cookey, MD   Referred by: Dorena Cookey, MD  Burlene Arnt, CMA acting as scribe for Dr. [name].  SUBJECTIVE:   Chief Complaint  Patient presents with  . pain in right elbow   HPI: As below and per problem based documentation when appropriate.  Pt c/o pain in the medial and posterior aspect of the right elbow.  Pain in elbow started about 3 months ago Pt was splitting wood and may have hyperextended the right arm. His proximal anterior arm has gotten better but the elbow it still giving him trouble.  No pain over the central elbow pain is focally located currently over the medial epicondyle specifically.   The pain is described as tenderness, occasional sharp pain and is rated as 8/10 during flare-up.  Worsened with swinging motion (when playing golf), twisting, rotating Improves with rest Therapies tried include Aleve. Pt has not tried ice, heat or topical cream/gel.  Other associated symptoms include: some radiating pain into the right shoulder.   Pt had steroid injection at [facility] in the past, around Feb, and got some relief from that.     Review of Systems  Constitutional: Negative for chills and fever.  Respiratory: Negative for shortness of breath.   Cardiovascular: Negative for chest pain.  Gastrointestinal: Negative for constipation and diarrhea.  Musculoskeletal: Positive for joint pain.  Skin: Negative for itching and rash.  Neurological: Positive for tremors (not related to elbow per pt) and weakness (weakness in right arm). Negative for dizziness, tingling and headaches.  Endo/Heme/Allergies: Does not bruise/bleed easily.    Otherwise per HPI.  HISTORY & PERTINENT PRIOR DATA:  No specialty comments available. He reports  that he has never smoked. He has quit using smokeless tobacco. No results for input(s): HGBA1C, LABURIC in the last 8760 hours. Medications & Allergies reviewed per EMR Patient Active Problem List   Diagnosis Date Noted  . Right elbow pain 09/15/2016  . Pain in joint of right elbow 09/14/2016  . Routine general medical examination at a health care facility 07/29/2015  . ED (erectile dysfunction) 09/22/2011  . Hyperlipidemia 04/26/2008  . RESTLESS LEG SYNDROME 12/13/2006  . RHINITIS, ALLERGIC, DUE TO POLLEN 12/13/2006   Past Medical History:  Diagnosis Date  . Allergic rhinitis   . Diverticulosis 1988  . Hearing loss   . Hyperlipidemia   . Restless leg syndrome    Family History  Problem Relation Age of Onset  . Diabetes Father   . Heart failure Father   . Lung cancer Mother   . Diabetes Brother    Past Surgical History:  Procedure Laterality Date  . COLONOSCOPY    . MENISCUS REPAIR Right   . PARTIAL COLECTOMY  1998   for severe diverticulitis   . Rhinoseptoplasty    . 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 Smoking status: Never Smoker  . Smokeless tobacco: Former Systems developer  . Alcohol use Yes     Comment: 2-3 beers a day  . Drug use: No     Comment: marijuana weekly  . Sexual activity: Not on file    OBJECTIVE:  VS:  HT:5' 8.5" (174 cm)  WT:195 lb 3.2 oz (88.5 kg)  BMI:29.3    BP:130/80  HR:85bpm  TEMP: ( )  RESP:96 % EXAM: Findings:  WDWN, NAD, Non-toxic appearing Alert & appropriately interactive Not depressed or anxious appearing No increased work of breathing. Pupils are equal. EOM intact without nystagmus No clubbing or cyanosis of the extremities appreciated No significant rashes/lesions/ulcerations overlying the examined area. Radial pulses 2+/4.  No significant generalized UE edema. Sensation intact to light touch in upper extremities.  Right shoulder: IR and  ER strength is normal.  Overall quite muscular.  He does have slight deformity on the right.  No bruising or ecchymosis.  Negative Hawkins and Neer's  Right elbow: Mild focal TTP over the lateral epicondyle.  He has no pain with palpation of the fairly severe TTP over the medial epicondyles specifically over the posterior aspect.  Negative Tinel's.  Marked pain with grip squeeze and ulnar deviation localizing to the medial epicondyle.  Minimal pain with wrist extension.  No pain with palpation of the distal biceps or with hook test.  Supination and pronation strength is intact although painful to supination localizing to the medial elbow.    ++++++++++++++++++++++++++++++++++++++++++++++++++++++++++++++++++ LIMITED MSK ULTRASOUND OF right elbow and limited views of shoulder Images were obtained and interpreted by myself, Teresa Coombs, DO  Images have been saved and stored to PACS system. Images obtained on: GE S7 Ultrasound machine  FINDINGS:  Marked hypoechoic change within both medial and lateral epicondyles consistent with a interstitial tear of the lateral condyle and chronic tendinosis of the medial epicondyle. Bicipital groove in the right shoulder is empty with a moderate degree of fluid within the groove.  There is a contracture of muscle within the proximal biceps.     IMPRESSION:  1. 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 Proximal biceps tendon rupture, subacute       Dg Elbow 2 Views Right  Result Date: 09/15/2016 CLINICAL DATA:  Healing posterior right elbow pain for the past 3 months possibly related to a hyperextension injury while splitting wood. EXAM: RIGHT ELBOW - 2 VIEW COMPARISON:  None in PACs FINDINGS: The bones are subjectively adequately mineralized. There is no acute or healing fracture. There is no dislocation. There are no abnormal soft tissue calcifications. There is no joint effusion. There may be a small a olecranon  bursal effusion. IMPRESSION: There is no acute or significant chronic bony abnormality of the right elbow. There may be a small olecranon bursal effusion. Electronically Signed   By: David  Martinique M.D.   On: 09/15/2016 11:45   ASSESSMENT & PLAN:   Problem List Items Addressed This Visit    Right elbow pain - Primary    Patient did undergo cortical steroid injection for the lateral condyle [facility].  This time we did discuss addition of nitroglycerin both the medial and lateral epicondyles.  Therapeutic exercises were reviewed in detail by myself for both medial and lateral epicondylosis.  Additionally has a small amount of bursal fluid in his right shoulder and we did discuss therapeutic exercises for this.  Suspect overuse of the elbow following the proximal biceps rupture.  He has only minimal pain at this time in the shoulder and this seems to be improving so we will continue with conservative care but if any lack of improvement consider further evaluation of this specifically.  +++++++++++++++++++++++++++++++++++++++++++++++++++++++++++++++ PROCEDURE NOTE: THERAPEUTIC EXERCISES (97110) 15 minutes spent for Therapeutic exercises as stated in above  notes.  This included exercises focusing on stretching, strengthening, with significant focus on eccentric aspects.   Proper technique shown and discussed handout in great detail with myself.  All questions were discussed and answered.         Relevant Medications   nitroGLYCERIN (NITRODUR - DOSED IN MG/24 HR) 0.2 mg/hr patch   Other Relevant Orders   DG Elbow 2 Views Right (Completed)   [place] LIMITED JOINT SPACE STRUCTURES UP RIGHT(NO LINKED CHARGES)    Other Visit Diagnoses    Chronic right shoulder pain       Subacromial bursitis       Biceps tendon rupture, proximal, right, initial encounter       Limited views of the right shoulder obtained today did show an empty bicipital groove with hypoechoic change.   Discussed continued conservative care.   Medial epicondylitis of right elbow       Lateral epicondylitis of right elbow          Follow-up: Return in about 6 weeks (around 10/27/2016).   CMA/ATC served as Education administrator during this visit. History, Physical, and Plan performed by medical provider. Documentation and orders reviewed and attested to.      Teresa Coombs, [facility] Physician    09/15/2016 11:44 PM

## 2016-09-15 NOTE — Assessment & Plan Note (Addendum)
Patient did undergo cortical steroid injection for the lateral condyle [facility]  This time we did discuss addition of nitroglycerin both the medial and lateral epicondyles.  Therapeutic exercises were reviewed in detail by myself for both medial and lateral epicondylosis.  Additionally has a small amount of bursal fluid in his right shoulder and we did discuss therapeutic exercises for this.  Suspect overuse of the elbow following the proximal biceps rupture.  He has only minimal pain at this time in the shoulder and this seems to be improving so we will continue with conservative care but if any lack of improvement consider further evaluation of this specifically.  +++++++++++++++++++++++++++++++++++++++++++++++++++++++++++++++ PROCEDURE NOTE: THERAPEUTIC EXERCISES (97110) 15 minutes spent for Therapeutic exercises as stated in above notes.  This included exercises focusing on stretching, strengthening, with significant focus on eccentric aspects.   Proper technique shown and discussed handout in great detail with myself.  All questions were discussed and answered.

## 2016-10-21 ENCOUNTER — Encounter: Payer: Self-pay | Admitting: Sports Medicine

## 2016-10-21 ENCOUNTER — Ambulatory Visit (INDEPENDENT_AMBULATORY_CARE_PROVIDER_SITE_OTHER): Payer: Managed Care, Other (non HMO) | Admitting: Sports Medicine

## 2016-10-21 VITALS — BP 138/88 | HR 84 | Ht 68.5 in | Wt 196.2 lb

## 2016-10-21 DIAGNOSIS — G8929 Other chronic pain: Secondary | ICD-10-CM | POA: Insufficient documentation

## 2016-10-21 DIAGNOSIS — S46211A Strain of muscle, fascia and tendon of other parts of biceps, right arm, initial encounter: Secondary | ICD-10-CM

## 2016-10-21 DIAGNOSIS — M25521 Pain in right elbow: Secondary | ICD-10-CM

## 2016-10-21 DIAGNOSIS — M7701 Medial epicondylitis, right elbow: Secondary | ICD-10-CM

## 2016-10-21 DIAGNOSIS — M25511 Pain in right shoulder: Secondary | ICD-10-CM | POA: Diagnosis not present

## 2016-10-21 NOTE — Assessment & Plan Note (Addendum)
He is failed conservative measures with prior normal x-ray, prior failure of an injection as well as nitroglycerin protocol. There was a prior interstitial split along the common flexor tendons on MSK ultrasound.  Given the lack of improvement consideration for open surgical procedure versus PRP versus Tenex should be considered.  MRI for further diagnostic evaluation prior to proceeding with any more invasive procedure will be undergone.  Low likelihood of distal biceps tear given the proximity of the pain however given the proximal pain in correlation of his elbow beginning to hurt after shoulder this is a possibility.  Will plan to proceed with the MRI of the elbow first and if this is unrevealing follow-up with MRI of the shoulder.

## 2016-10-21 NOTE — Patient Instructions (Signed)

## 2016-10-21 NOTE — Progress Notes (Signed)
OFFICE VISIT NOTE [place] [facility]. Tishia Maestre, [facility]  LAIRD RUNNION - 61 y.o. male MRN [id number]  Date of birth: [birth year]  Visit Date: 10/21/2016  PCP: Dorena Cookey, MD   Referred by: Dorena Cookey, MD  Burlene Arnt, CMA acting as scribe for Dr. [name].  SUBJECTIVE:   Chief Complaint  Patient presents with  . Follow-up    bicep tendon rupture  . Follow-up    lateral epicondylosis   HPI: As below and per problem based documentation when appropriate.  Pt presents today in follow-up of a proximal bicep tendon rupture. He had xray of the elbow and u/s at last visit. Pt was advised to follow nitro protocol and given home exercises.   Pt has been using the Nitroglycerin patches and doing home exercises and does not feel any relief. He is still having a lot of pain in the right medial elbow. Pain rated about 7/10 and is fairly constant. Its a continuous "nagging". There is nothing that really seems to alleviate the pain. He has be trying to swim and tread water but it seems to make the pain a little worse. Pain is worse with any type of movement. He is also continuing to feel pain in the right posterior shoulder. He denies pain in the hand or fingers. He denies pain in the cervical region. He has not noticed any pain or increased warmth near the right elbow. Ulnar nerve irritation is present at times. He is taking Aleve BID and gets minimal relief.   Pt denies fever, chills, night sweats.     Review of Systems  Constitutional: Negative for chills and fever.  Respiratory: Negative for shortness of breath and wheezing.   Cardiovascular: Negative for chest pain and palpitations.  Musculoskeletal: Negative for falls.  Neurological: Positive for headaches. Negative for dizziness and tingling.  Endo/Heme/Allergies: Bruises/bleeds easily.    Otherwise per HPI.  HISTORY & PERTINENT PRIOR DATA:  No specialty comments  available. He reports that he has never smoked. He has quit using smokeless tobacco. No results for input(s): HGBA1C, LABURIC in the last 8760 hours. Medications & Allergies reviewed per EMR Patient Active Problem List   Diagnosis Date Noted  . Chronic right shoulder pain 10/21/2016  . Right elbow pain 09/15/2016  . Pain in joint of right elbow 09/14/2016  . Routine general medical examination at a health care facility 07/29/2015  . ED (erectile dysfunction) 09/22/2011  . Hyperlipidemia 04/26/2008  . RESTLESS LEG SYNDROME 12/13/2006  . RHINITIS, ALLERGIC, DUE TO POLLEN 12/13/2006   Past Medical History:  Diagnosis Date  . Allergic rhinitis   . Diverticulosis 1988  . Hearing loss   . Hyperlipidemia   . Restless leg syndrome    Family History  Problem Relation Age of Onset  . Diabetes Father   . Heart failure Father   . Lung cancer Mother   . Diabetes Brother    Past Surgical History:  Procedure Laterality Date  . COLONOSCOPY    . MENISCUS REPAIR Right   . PARTIAL COLECTOMY  1998   for severe diverticulitis   . Rhinoseptoplasty    . 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 Smoking status: Never Smoker  . Smokeless tobacco: Former Systems developer  . Alcohol use Yes     Comment: 2-3 beers a day  .  Drug use: No     Comment: marijuana weekly  . Sexual activity: Not on file    OBJECTIVE:  VS:  HT:5' 8.5" (174 cm)   WT:196 lb 3.2 oz (89 kg)  BMI:29.5    BP:138/88  HR:84bpm  TEMP: ( )  RESP:96 % EXAM: Findings:  Right upper extremity is overall well aligned.  He has marked pain along the medial epicondyle and medial flexor tendons.  This is worse with resisted wrist flexion.  He has no significant tenderness palpation along the anterior shoulder.  Minimal pain with internal rotation and external rotation of the shoulder.  No pain with axial loading and circumduction.  Mild pain in both the  shoulder and the elbow with speeds testing and O'Brien's testing.     No results found. ASSESSMENT & PLAN:   Problem List Items Addressed This Visit    Right elbow pain - Primary    He is failed conservative measures with prior normal x-ray, prior failure of an injection as well as nitroglycerin protocol. There was a prior interstitial split along the common flexor tendons on MSK ultrasound.  Given the lack of improvement consideration for open surgical procedure versus PRP versus Tenex should be considered.  MRI for further diagnostic evaluation prior to proceeding with any more invasive procedure will be undergone.  Low likelihood of distal biceps tear given the proximity of the pain however given the proximal pain in correlation of his elbow beginning to hurt after shoulder this is a possibility.  Will plan to proceed with the MRI of the elbow first and if this is unrevealing follow-up with MRI of the shoulder.      Relevant Orders   MR ELBOW RIGHT WO CONTRAST   Chronic right shoulder pain    Other Visit Diagnoses    Biceps tendon rupture, proximal, right, initial encounter       Medial epicondylitis of right elbow          Follow-up: Return for MRI review.   CMA/ATC served as Education administrator during this visit. History, Physical, and Plan performed by medical provider. Documentation and orders reviewed and attested to.      Teresa Coombs, [facility] Physician

## 2016-10-26 ENCOUNTER — Ambulatory Visit: Payer: Managed Care, Other (non HMO) | Admitting: Sports Medicine

## 2016-11-13 ENCOUNTER — Ambulatory Visit
Admission: RE | Admit: 2016-11-13 | Discharge: 2016-11-13 | Disposition: A | Discharge status: Home / Self Care | Payer: Managed Care, Other (non HMO) | Source: Ambulatory Visit | Attending: Sports Medicine | Admitting: Sports Medicine

## 2016-11-13 DIAGNOSIS — M25521 Pain in right elbow: Secondary | ICD-10-CM

## 2016-11-19 ENCOUNTER — Ambulatory Visit (INDEPENDENT_AMBULATORY_CARE_PROVIDER_SITE_OTHER): Payer: Managed Care, Other (non HMO) | Admitting: Sports Medicine

## 2016-11-19 VITALS — BP 124/88 | HR 80 | Ht 68.5 in | Wt 194.8 lb

## 2016-11-19 DIAGNOSIS — M7701 Medial epicondylitis, right elbow: Secondary | ICD-10-CM

## 2016-11-19 DIAGNOSIS — M25521 Pain in right elbow: Secondary | ICD-10-CM

## 2016-11-19 NOTE — Progress Notes (Signed)
OFFICE VISIT NOTE Casey Kim. Casey Kim, [name] [place]  Casey Kim - 61 y.o. male MRN [id number]  Date of birth: 07-Mar-1956  Visit Date: 11/19/2016  PCP: Dorena Cookey, MD   Referred by: Dorena Cookey, MD  Burlene Arnt, CMA acting as scribe for Dr. [name].  SUBJECTIVE:   Chief Complaint  Patient presents with  . Follow-up    right elbow pain   HPI: As below and per problem based documentation when appropriate.  Pt presents today in follow-up of right shoulder pain. He had MRI done 11/13/2016. He would like to discuss the results of the MRI as well as treatment options.  MRI results are as follows: CLINICAL DATA:  Injured shoulder and elbow 3 months ago after cutting wood. EXAM: MRI OF THE RIGHT ELBOW WITHOUT CONTRAST TECHNIQUE: Multiplanar, multisequence MR imaging of the elbow was performed. No intravenous contrast was administered. COMPARISON:  None. FINDINGS: TENDONS Common forearm flexor origin: Moderate tendinosis of the common flexor tendon with a high-grade partial-thickness tear. Common forearm extensor origin: Intact. Biceps: Intact. Triceps: Intact. LIGAMENTS Medial stabilizers: Intact. Lateral stabilizers:  Tear of the radial collateral ligament. Cartilage: No chondral defect. Joint: No joint effusion. Cubital tunnel: Normal. Bones: No focal marrow signal abnormality. No fracture or dislocation. IMPRESSION: 1. Moderate tendinosis of the common flexor tendon with a high-grade partial-thickness tear.      Review of Systems  Constitutional: Negative for chills and fever.  Respiratory: Negative for shortness of breath and wheezing.   Cardiovascular: Negative for chest pain and palpitations.  Musculoskeletal: Negative for falls.  Neurological: Negative for dizziness and headaches.  Endo/Heme/Allergies: Does not bruise/bleed easily.    Otherwise per HPI.  HISTORY &  PERTINENT PRIOR DATA:  No specialty comments available. He reports that he has never smoked. He has quit using smokeless tobacco. No results for input(s): HGBA1C, LABURIC in the last 8760 hours. Medications & Allergies reviewed per EMR Patient Active Problem List   Diagnosis Date Noted  . Chronic right shoulder pain 10/21/2016  . Right elbow pain 09/15/2016  . Medial epicondylitis of elbow, right 09/14/2016  . Routine general medical examination at a health care facility 07/29/2015  . ED (erectile dysfunction) 09/22/2011  . Hyperlipidemia 04/26/2008  . RESTLESS LEG SYNDROME 12/13/2006  . RHINITIS, ALLERGIC, DUE TO POLLEN 12/13/2006   Past Medical History:  Diagnosis Date  . Allergic rhinitis   . Diverticulosis 1988  . Hearing loss   . Hyperlipidemia   . Restless leg syndrome    Family History  Problem Relation Age of Onset  . Diabetes Father   . Heart failure Father   . Lung cancer Mother   . Diabetes Brother    Past Surgical History:  Procedure Laterality Date  . COLONOSCOPY    . MENISCUS REPAIR Right   . PARTIAL COLECTOMY  1998   for severe diverticulitis   . Rhinoseptoplasty    . TONSILLECTOMY     Social History   Occupational History  . sale Casey Kim History Main Topics  . Smoking status: Never Smoker  . Smokeless tobacco: Former Systems developer  . Alcohol use Yes     Comment: 2-3 beers a day  . Drug use: No     Comment: marijuana weekly  . Sexual activity: Not on file    OBJECTIVE:  VS:  HT:5' 8.5" (174 cm)   WT:194 lb 12.8 oz (88.4 kg)  BMI:29.2    BP:124/88  HR:80bpm  TEMP: ( )  RESP:96 % EXAM: Findings:  Right elbow is overall well aligned.  No significant deformity.  He does have pain along the medial epicondyle.  Full flexion and extension.  Pain with resisted wrist flexion.    Mr Elbow Right Wo Contrast  Result Date: 11/13/2016 CLINICAL DATA:  Injured shoulder and elbow 3 months ago after cutting wood. EXAM: MRI OF THE  RIGHT ELBOW WITHOUT CONTRAST TECHNIQUE: Multiplanar, multisequence MR imaging of the elbow was performed. No intravenous contrast was administered. COMPARISON:  None. FINDINGS: TENDONS Common forearm flexor origin: Moderate tendinosis of the common flexor tendon with a high-grade partial-thickness tear. Common forearm extensor origin: Intact. Biceps: Intact. Triceps: Intact. LIGAMENTS Medial stabilizers: Intact. Lateral stabilizers:  Tear of the radial collateral ligament. Cartilage: No chondral defect. Joint: No joint effusion. Cubital tunnel: Normal. Bones: No focal marrow signal abnormality. No fracture or dislocation. IMPRESSION: 1. Moderate tendinosis of the common flexor tendon with a high-grade partial-thickness tear. Electronically Signed   By: Kathreen Devoid   On: 11/13/2016 10:59   ASSESSMENT & PLAN:   Problem List Items Addressed This Visit    Medial epicondylitis of elbow, right - Primary    MRI does confirm that he does not have any distal biceps tendinosis.  He does have a high-grade partial-thickness tear of the common flexor tendon as well as moderate tendinosis of this tendon.  Greater than 50% of this 30 minute visit was spent in direct patient counseling and coordination of care ultimately PRP and/or Tenex options discussed with the patient versus continued conservative management and surgical open management.  He would like to try to pursue Tenex as an option and referral to Dr. Stacey Drain at [facility] will be placed.  He would like to discuss whether this will be covered from an insurance standpoint or not.  He has previously undergone a corticosteroid injection from previous providers as well as failed nitroglycerin therapy and eccentric loading.  If Tenex is not a comfortable entity he is interested in PRP and I discussed that I am happy to do this for him at any time and discussed the preprocedural steps as well as postprocedural recovery for all of the above entities.           Follow-up: Return if symptoms worsen or fail to improve.   CMA/ATC served as Education administrator during this visit. History, Physical, and Plan performed by medical provider. Documentation and orders reviewed and attested to.      Teresa Coombs, [place] [facility] Physician

## 2016-11-19 NOTE — Assessment & Plan Note (Addendum)
MRI does confirm that he does not have any distal biceps tendinosis.  He does have a high-grade partial-thickness tear of the common flexor tendon as well as moderate tendinosis of this tendon.  Greater than 50% of this 30 minute visit was spent in direct patient counseling and coordination of care ultimately PRP and/or Tenex options discussed with the patient versus continued conservative management and surgical open management.  He would like to try to pursue Tenex as an option and referral to Dr. Stacey Drain at [facility] will be placed.  He would like to discuss whether this will be covered from an insurance standpoint or not.  He has previously undergone a corticosteroid injection from previous providers as well as failed nitroglycerin therapy and eccentric loading.  If Tenex is not a comfortable entity he is interested in PRP and I discussed that I am happy to do this for him at any time and discussed the preprocedural steps as well as postprocedural recovery for all of the above entities.

## 2016-11-30 ENCOUNTER — Encounter: Payer: Self-pay | Admitting: Sports Medicine

## 2017-01-22 ENCOUNTER — Encounter: Payer: Self-pay | Admitting: Family Medicine

## 2017-03-02 ENCOUNTER — Ambulatory Visit (INDEPENDENT_AMBULATORY_CARE_PROVIDER_SITE_OTHER): Payer: Managed Care, Other (non HMO)

## 2017-03-02 DIAGNOSIS — Z23 Encounter for immunization: Secondary | ICD-10-CM | POA: Diagnosis not present

## 2017-03-02 NOTE — Progress Notes (Signed)
Shingrix

## 2017-05-22 IMAGING — DX DG ELBOW 2V*R*
2 series · 2 of 2 positions shown · non-contrast
Comparison: None in PACs

CLINICAL DATA: Healing posterior right elbow pain for the past 3
months possibly related to a hyperextension injury while splitting
Ashwini.

EXAM:
RIGHT ELBOW - 2 VIEW

[elbow ap]
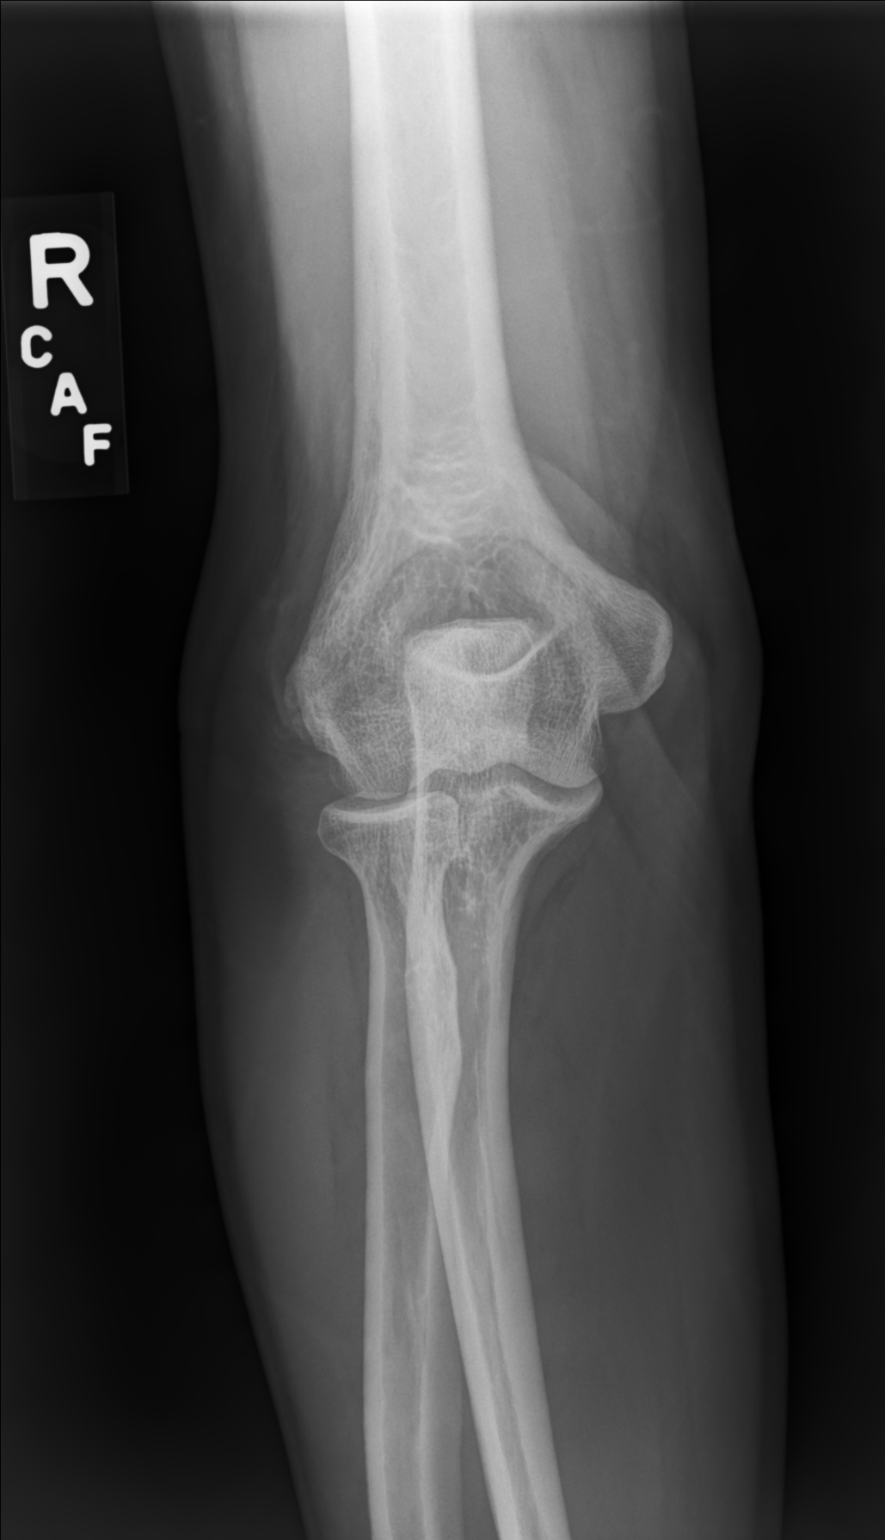

[elbow lat]
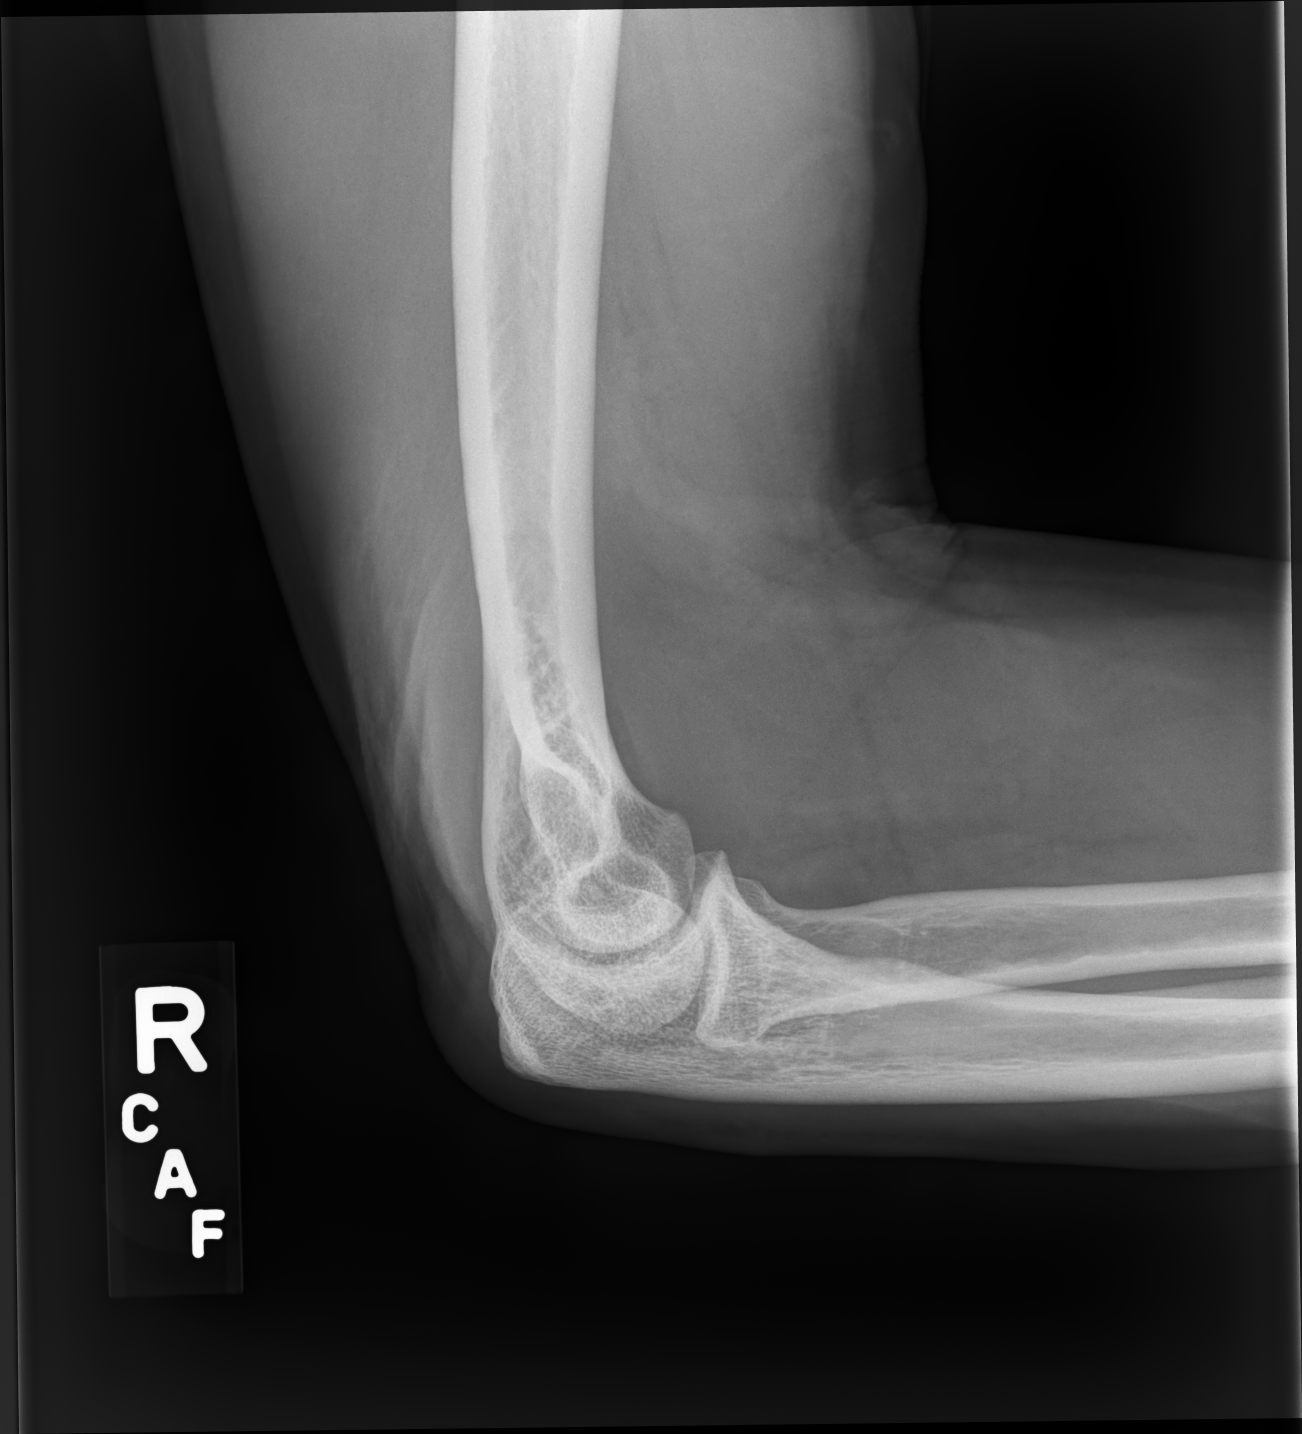

[2 of 2 positions shown; findings below may reference images not displayed]

FINDINGS: The bones are subjectively adequately mineralized. There is no acute
or healing fracture. There is no dislocation. There are no abnormal
soft tissue calcifications. There is no joint effusion. There may be
a small a olecranon bursal effusion.
IMPRESSION: There is no acute or significant chronic bony abnormality of the
right elbow. There may be a small olecranon bursal effusion.

## 2017-06-18 ENCOUNTER — Ambulatory Visit: Payer: Self-pay | Admitting: *Deleted

## 2017-06-18 NOTE — Telephone Encounter (Signed)
Pt calling with severe diarrhea. Pt states he has diarrhea at least 20 times in a 24 hour period.Pt states it is watery with some greenish to brown particles, but mainly water. Pt thinks it may be related to cheese he consumed on Sunday.  States diarrhea began Sunday. No abdominal pain. Pt states he is drinking Gatorade and water and has no sx of dehydration. He last voided this am. Home care advice given. Pt stated that if he is not better by Monday, he will call back for an appointment.   Reason for Disposition . SEVERE diarrhea (e.g., 7 or more times / day more than normal) . [1] SEVERE diarrhea (e.g., 7 or more times / day more than normal) AND [2]  age > 60 years    Pt wanting to try home advice first. He states he will call Monday if no better. Pt states he is drinking a lot of water and Gatorade. . [1] SEVERE diarrhea (e.g., 7 or more times / day more than normal) AND [2] present > 24 hours (1 day)    Pt states he wants to wait until Monday and if diarrhea persists, he will make an appt to be seen.  Answer Assessment - Initial Assessment Questions 1. DIARRHEA SEVERITY: "How bad is the diarrhea?" "How many extra stools have you had in the past 24 hours than normal?"    - MILD: Few loose or mushy BMs; increase of 1-3 stools over normal daily number of stools; mild increase in ostomy output.   - MODERATE: Increase of 4-6 stools daily over normal; moderate increase in ostomy output.   - SEVERE (or Worst Possible): Increase of 7 or more stools daily over normal; moderate increase in ostomy output; incontinence.     Severe 20 times 2. ONSET: "When did the diarrhea begin?"      Sunday 3. BM CONSISTENCY: "How loose or watery is the diarrhea?"      Greenish to brown particles but mainly water 4. VOMITING: "Are you also vomiting?" If so, ask: "How many times in the past 24 hours?"      no 5. ABDOMINAL PAIN: "Are you having any abdominal pain?" If yes: "What does it feel like?" (e.g., crampy, dull,  intermittent, constant)      no 6. ABDOMINAL PAIN SEVERITY: If present, ask: "How bad is the pain?"  (e.g., Scale 1-10; mild, moderate, or severe)    - MILD (1-3): doesn't interfere with normal activities, abdomen soft and not tender to touch     - MODERATE (4-7): interferes with normal activities or awakens from sleep, tender to touch     - SEVERE (8-10): excruciating pain, doubled over, unable to do any normal activities       none 7. ORAL INTAKE: If vomiting, "Have you been able to drink liquids?" "How much fluids have you had in the past 24 hours?"     No vomiting 8. HYDRATION: "Any signs of dehydration?" (e.g., dry mouth [not just dry lips], too weak to stand, dizziness, new weight loss) "When did you last urinate?"     No- this am  9. EXPOSURE: "Have you traveled to a foreign country recently?" "Have you been exposed to anyone with diarrhea?" "Could you have eaten any food that was spoiled?"     No travel- no exposure-may be cheese back on Sunday 10. OTHER SYMPTOMS: "Do you have any other symptoms?" (e.g., fever, blood in stool)       No - has hemorrhoids - no  other sx 11. PREGNANCY: "Is there any chance you are pregnant?" "When was your last menstrual period?"       n/a  Protocols used: DIARRHEA-A-AH

## 2017-07-26 ENCOUNTER — Ambulatory Visit: Payer: Managed Care, Other (non HMO) | Admitting: Sports Medicine

## 2017-07-26 ENCOUNTER — Encounter: Payer: Self-pay | Admitting: Sports Medicine

## 2017-07-26 ENCOUNTER — Ambulatory Visit: Payer: Self-pay

## 2017-07-26 VITALS — BP 138/80 | HR 75 | Ht 68.5 in | Wt 199.4 lb

## 2017-07-26 DIAGNOSIS — G8929 Other chronic pain: Secondary | ICD-10-CM | POA: Diagnosis not present

## 2017-07-26 DIAGNOSIS — M7550 Bursitis of unspecified shoulder: Secondary | ICD-10-CM

## 2017-07-26 DIAGNOSIS — M7701 Medial epicondylitis, right elbow: Secondary | ICD-10-CM | POA: Diagnosis not present

## 2017-07-26 DIAGNOSIS — M25511 Pain in right shoulder: Secondary | ICD-10-CM | POA: Diagnosis not present

## 2017-07-26 DIAGNOSIS — M75101 Unspecified rotator cuff tear or rupture of right shoulder, not specified as traumatic: Secondary | ICD-10-CM | POA: Insufficient documentation

## 2017-07-26 DIAGNOSIS — S46211A Strain of muscle, fascia and tendon of other parts of biceps, right arm, initial encounter: Secondary | ICD-10-CM

## 2017-07-26 NOTE — Progress Notes (Signed)
  Casey Kim - 62 y.o. male MRN [id number]  Date of birth: 06-Feb-1956  Visit Date:   PCP: Dorena Cookey, MD   Referred by: Dorena Cookey, MD  Please see additional documentation for HPI, review of systems.  HISTORY & PERTINENT PRIOR DATA:  Prior History reviewed and updated per electronic medical record.  Significant/pertinent history, findings, studies include:  reports that he has never smoked. He has quit using smokeless tobacco. No results for input(s): HGBA1C, LABURIC, CREATINE in the last 8760 hours. No specialty comments available. Problem  Rotator Cuff Syndrome of Right Shoulder    OBJECTIVE:  VS:  HT:5' 8.5" (174 cm)   WT:199 lb 6.4 oz (90.4 kg)  BMI:29.87    BP:138/80  HR:75bpm  TEMP: ( )  RESP:97 %   PHYSICAL EXAM: WDWN, Non-toxic appearing. Psychiatric: Alert & appropriately interactive.  Not depressed or anxious appearing. Respiratory: No increased work of breathing.  Trachea Midline Eyes: Pupils are equal.  EOM intact without nystagmus.  No scleral icterus  VASCULAR: warm to touch Radial Pulses: normal upperextremity neuro exam: unremarkable normal strength normal sensation  Right Shoulder Exam: Normal alignment & Contours Skin: No overlying erythema/ecchymosis Neck: normal range of motion and supple Axial loading and circumduction produces: No pain or crepitation  Non tender to palpation over: Bony Landmarks TTP over: posterior acromial Drop arm test: negative Hawkins: normal, no pain  Neers: normal, no pain   Internal Rotation:  ROM: Normal with no pain Strength: Normal External Rotation:  ROM: Normal with no pain Strength: Normal  Empty can: positive, mild pain Strength: 4/5 Speeds: positive, mild pain Strength: 4/5 O'Briens: positive, moderate pain Strength: 4/5   Right elbow: Full flexion extension.  TTP over the medial condyle and slightly over the lateral epicondyle but this is minimal.   ASSESSMENT & PLAN:  1. Chronic right  shoulder pain   2. Medial epicondylitis of elbow, right   3. Biceps tendon rupture, proximal, right, initial encounter   4. Subacromial bursitis   5. Rotator cuff syndrome of right shoulder     PLAN:  return for prp to elbow and shoulder (1 kit divided). Therapeutic exercises will need to be deferred until 2 weeks post procedure.  We will plan to have him in a sling for these 2 weeks when active but okay to come out of for gentle range of motion. PRP the risks benefits and investigational status discussed in detail.  He is aware this is a cash brace procedure but would prefer to do this prior to Tenex.  If any lack of improvement could consider Tenex for either of these issues versus further diagnostic evaluation with MRI of the shoulder as well but this does appear to be a serpiginous interstitial split although it does seem to be full-thickness.  Discussed that the anticipated amount of time for healing is 12- 24 weeks for Tendinopathic changes.  Emphasized the importance of improving blood flow as well as eccentric loading of the tendon.  We will plan to check in in 6 weeks and repeat ultrasound at that time.          Follow-up: Return for R elbow and shoulder PRP.

## 2017-07-26 NOTE — Progress Notes (Signed)
  Casey Kim. Casey Kim, Casey Kim  Casey Kim - 62 y.o. male MRN [id number]  Date of birth: [birth year]  Visit Date: 07/26/2017  PCP: Dorena Cookey, MD   Referred by: Dorena Cookey, MD  Scribe for today's visit: Wendy Poet, LAT, ATC     SUBJECTIVE:  TA[employer]ING "Casey Kim" is here for Follow-up (R shoulder and elbow pain) .   Notes from OV on 11/19/16: Pt presents today in follow-up of right shoulder pain. He had MRI done 11/13/2016. He would like to discuss the results of the MRI as well as treatment options.  MRI results are as follows: CLINICAL DATA:  Injured shoulder and elbow 3 months ago after cutting wood. EXAM: MRI OF THE RIGHT ELBOW WITHOUT CONTRAST TECHNIQUE: Multiplanar, multisequence MR imaging of the elbow was performed. No intravenous contrast was administered. COMPARISON:  None. FINDINGS: TENDONS Common forearm flexor origin: Moderate tendinosis of the common flexor tendon with a high-grade partial-thickness tear. Common forearm extensor origin: Intact. Biceps: Intact. Triceps: Intact. LIGAMENTS Medial stabilizers: Intact. Lateral stabilizers:  Tear of the radial collateral ligament. Cartilage: No chondral defect. Joint: No joint effusion. Cubital tunnel: Normal. Bones: No focal marrow signal abnormality. No fracture or dislocation. IMPRESSION: 1. Moderate tendinosis of the common flexor tendon with a high-grade partial-thickness tear.    Compared to the last office visit on 11/19/16, his previously described R elbow and and R shoulder symptoms are worsening.  Pt states that he has been splitting a lot of wood from Dec. 2018 - now. Current symptoms are moderate & are radiating to R upper arm.  Does get some N/T when he sleeps on his R side. He has been taking Aleve intermittenlty but is no longer using the nitroglycerin patches and is not doing his HEP currently.   Wants to discuss doing PRP for both his R elbow and shoulder.  He went to see Dr. Merrilyn Puma at [facility] in July 2018 and was going to have a Tenex procedure w/ him but had to cancel the appt and never re-scheduled.  ROS Reports night time disturbances. Denies fevers, chills, or night sweats. Denies unexplained weight loss. Denies personal history of cancer. Denies changes in bowel or bladder habits. Reports recent unreported falls.  Fell on his L hip when he slipped on his stairs. Denies new or worsening dyspnea or wheezing. Denies headaches or dizziness.  Reports numbness, tingling or weakness  In the extremities, in the R UE occasionally. Denies dizziness or presyncopal episodes Denies lower extremity edema      Please see additional documentation for Objective, Assessment and Plan sections. Pertinent additional documentation may be included in corresponding procedure notes, imaging studies, problem based documentation and patient instructions. Please see these sections of the encounter for additional information regarding this visit.  CMA/ATC served as Education administrator during this visit. History, Physical, and Plan performed by medical provider. Documentation and orders reviewed and attested to.      Gerda Diss, [place] [facility] Physician

## 2017-07-26 NOTE — Procedures (Signed)
LIMITED MSK ULTRASOUND OF Right shoulder and elbow Images were obtained and interpreted by myself, Teresa Coombs, DO  Images have been saved and stored to PACS system. Images obtained on: GE S7 Ultrasound machine  FINDINGS:  Biceps Tendon: Chronically torn appearing. Pec Major Insertion: Normal Subscapularis Tendon: Thickened without overt tearing Supraspinatus Tendon: Marked thickening with small subacromial bursitis with interstitial split that appears to be possibly full-thickness without any type of retraction however. Infraspinatus/Teres Minor Tendon: Normal AC Joint: Normal JOINT: No significant GH spurring appreciated LABRUM: Incompletely evaluated with MSK ultrasound however normal-appearing   IMPRESSION:  1. Full-thickness supraspinatus interstitial split without retraction with small amount of subacromial bursitis 2. Chronic appearing long head of the biceps tear

## 2017-07-26 NOTE — Patient Instructions (Signed)
We are going to be setting you up to perform a PRP (platelet rich plasma) injection. The cost of this is $495 out of pocket.  We will plan to have you come back at your convenience.  You will need to be off of anti-inflammatories for approximately 2 weeks prior to the injection and continue to avoid these medications (ibuprofen, Aleeve(naproxen), Meloxicam(mobic), Voltaren(diclofenac) during the initial 6 weeks following your injection. It is okay to take Tylenol if needed.

## 2017-08-10 ENCOUNTER — Ambulatory Visit: Payer: Self-pay

## 2017-08-10 ENCOUNTER — Ambulatory Visit: Payer: Managed Care, Other (non HMO) | Admitting: Sports Medicine

## 2017-08-10 ENCOUNTER — Encounter: Payer: Self-pay | Admitting: Sports Medicine

## 2017-08-10 ENCOUNTER — Ambulatory Visit (INDEPENDENT_AMBULATORY_CARE_PROVIDER_SITE_OTHER): Payer: Managed Care, Other (non HMO) | Admitting: Sports Medicine

## 2017-08-10 VITALS — BP 130/88 | HR 74 | Ht 68.5 in | Wt 194.0 lb

## 2017-08-10 DIAGNOSIS — M25511 Pain in right shoulder: Secondary | ICD-10-CM

## 2017-08-10 DIAGNOSIS — G8929 Other chronic pain: Secondary | ICD-10-CM

## 2017-08-10 DIAGNOSIS — M7701 Medial epicondylitis, right elbow: Secondary | ICD-10-CM

## 2017-08-10 MED ORDER — TRAMADOL HCL 50 MG PO TABS: 50.0000 mg | ORAL_TABLET | Freq: Four times a day (QID) | ORAL | 0 refills | Status: DC | PRN

## 2017-08-10 NOTE — Patient Instructions (Signed)
You had an injection today.  Things to be aware of after injection are listed below: . You may experience no significant improvement or even a slight worsening in your symptoms during the first 24 to 48 hours.  After that we expect your symptoms to improve gradually over the next 2 weeks for the medicine to have its maximal effect.  You should continue to have improvement out to 6 weeks after your injection. . Dr. [name] recommends icing the site of the injection for 20 minutes  1-2 times the day of your injection . You may shower but no swimming, tub bath or Jacuzzi for 24 hours. . If your bandage falls off this does not need to be replaced.  It is appropriate to remove the bandage after 4 hours. . You may resume light activities as tolerated unless otherwise directed per Dr. [name] during your visit    POSSIBLE PROCEDURE SIDE EFFECTS: The side effects of the injection are usually fairly minimal however if you may experience some of the following side effects that are usually self-limited and will is off on their own.  If you are concerned please feel free to call the office with questions:  Increased numbness or tingling  Nausea or vomiting  Swelling or bruising at the injection site   Please call our office if if you experience any of the following symptoms over the next week as these can be signs of infection:   Fever greater than 100.61F  Significant swelling at the injection site  Significant redness or drainage from the injection site  If after 2 weeks you are continuing to have worsening symptoms please call our office to discuss what the next appropriate actions should be including the potential for a return office visit or other diagnostic testing.

## 2017-08-10 NOTE — Progress Notes (Signed)
  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  Pt states that he has been splitting a lot of wood from Dec. 2018 - now. Current symptoms are moderate & are radiating to R upper arm.  Does get some N/T when he sleeps on his R side. He has been taking Aleve intermittenlty but is no longer using the nitroglycerin patches and is not doing his HEP currently.  Wants to discuss doing PRP for both his R elbow and shoulder.  He went to see Dr. Merrilyn Puma at [facility] in July 2018 and was going to have a Tenex procedure w/ him but had to cancel the appt and never re-scheduled.  08/10/2017 Compared to the last office visit, his previously described symptoms are actually worsening.  He is here today for injection into the supraspinatus tendon and lateral epicondyles using PRP.  ROS He denies any fevers, chills night sweats.  No significant overlying skin changes.   HISTORY & PERTINENT PRIOR DATA:  Prior History reviewed and updated per electronic medical record.  Significant/pertinent history, findings, studies include:  reports that he has never smoked. He has quit using smokeless tobacco. No results for input(s): HGBA1C, LABURIC, CREATINE in the last 8760 hours. No specialty comments available. No problems updated.  OBJECTIVE:  VS:  HT:5' 8.5" (174 cm)   WT:194 lb (88 kg)  BMI:29.07    BP:130/88  HR:74bpm  TEMP: ( )  RESP:97 %   PHYSICAL EXAM:  See prior office visit notes for further information.   ASSESSMENT & PLAN:    1. Medial epicondylitis of elbow, right   2. Chronic right shoulder pain     PLAN: PRP injection to both the supra spinatus tendon on the right and the right medial epicondyle.  Tramadol prescription provided.  Follow-up: Return in about 2 weeks (around 08/24/2017).       Please see additional documentation for Objective, Assessment and Plan sections. Pertinent additional documentation may be included in corresponding procedure notes, imaging studies, problem based documentation and patient instructions. Please see these sections of the encounter for additional information regarding this visit.  CMA/ATC served as Education administrator during this visit. History, Physical, and Plan performed by medical provider. Documentation and orders reviewed and attested to.      Gerda Diss, [place] [facility] Physician

## 2017-08-24 ENCOUNTER — Telehealth: Payer: Self-pay | Admitting: Sports Medicine

## 2017-08-24 ENCOUNTER — Ambulatory Visit: Payer: Managed Care, Other (non HMO) | Admitting: Sports Medicine

## 2017-08-24 NOTE — Telephone Encounter (Signed)
He should continue to avoid anti-inflammatory meds for a total of 6 weeks. Okay to be out of the sling. Gentle range of motion exercises (codmans) would be good to do daily. Hold off on further strengthening or lifting until I see him next week

## 2017-08-24 NOTE — Telephone Encounter (Signed)
Pt came in for appointment because he had phone problems and did not receive the messages about [name] being out all week. Pt would like to know what he can do and not do as far as physical activity and can he start taking any anti inflamatory again if needed. Pt is rescheduled for next week but would like a call back from a nurse if possible about the previous questions.

## 2017-08-24 NOTE — Telephone Encounter (Signed)
Pt stated he can be reached at his wife's phone number at [phone] and a detailed message can be left.

## 2017-08-24 NOTE — Telephone Encounter (Signed)
Called pt's wife's cell phone and spoke to her regarding pt's questions.  Informed her that the pt is to con't to avoid anti-inflammatories until 6 wks post-PRP injection.  Regarding activity, the pt is to stay limited w/ no R shoulder AROM above shoulder level.  Fine to do Codman's exercise for gentle R shoulder ROM and ok to active R elbow F/E.  OK for pt to be out of sling when he's not being overly active I.e going for a walk.  Pt's wife verbalized understanding and stated that she would pass the info on to her husband.  She did ask about CBD oil and whether or not he can use that.

## 2017-08-31 ENCOUNTER — Ambulatory Visit: Payer: Self-pay

## 2017-08-31 ENCOUNTER — Encounter: Payer: Self-pay | Admitting: Sports Medicine

## 2017-08-31 ENCOUNTER — Ambulatory Visit: Payer: Managed Care, Other (non HMO) | Admitting: Sports Medicine

## 2017-08-31 VITALS — BP 130/82 | HR 93 | Ht 68.5 in | Wt 197.0 lb

## 2017-08-31 DIAGNOSIS — M9907 Segmental and somatic dysfunction of upper extremity: Secondary | ICD-10-CM

## 2017-08-31 DIAGNOSIS — M7701 Medial epicondylitis, right elbow: Secondary | ICD-10-CM

## 2017-08-31 DIAGNOSIS — M25511 Pain in right shoulder: Secondary | ICD-10-CM | POA: Diagnosis not present

## 2017-08-31 DIAGNOSIS — M75101 Unspecified rotator cuff tear or rupture of right shoulder, not specified as traumatic: Secondary | ICD-10-CM | POA: Diagnosis not present

## 2017-08-31 DIAGNOSIS — G8929 Other chronic pain: Secondary | ICD-10-CM | POA: Diagnosis not present

## 2017-08-31 NOTE — Procedures (Signed)
LIMITED MSK ULTRASOUND OF Right shoulder, elbow Images were obtained and interpreted by myself, Teresa Coombs, DO  Images have been saved and stored to PACS system. Images obtained on: GE S7 Ultrasound machine  FINDINGS:   Shoulder has improved echotexture of the distal portion of the supraspinatus tendon  Common extensor tendon and common flexor tendons are improved appearance of the echotexture as well.  IMPRESSION:  1. Improving supraspinatus tendinopathy and probable tendinopathy status post PRP.

## 2017-08-31 NOTE — Progress Notes (Signed)
PROCEDURE NOTE : OSTEOPATHIC MANIPULATION The decision today to treat with Osteopathic Manipulative Therapy (OMT) was based on physical exam findings. Verbal consent was obtained following a discussion with the patient regarding the of risks, benefits and potential side effects, including an acute pain flare,post manipulation soreness and need for repeat treatments.     Contraindications to OMT reviewed and include: NONE  Manipulation was performed as below: Regions treated: Upper extremities OMT Techniques Used: muscle energy and myofascial release  The patient tolerated the treatment well and reported Improved symptoms following treatment today. Patient was given medications, exercises, stretches and lifestyle modifications per AVS and verbally.   OSTEOPATHIC/STRUCTURAL EXAM:   Flexed with posterior radial head.  Internally rotated shoulder.

## 2017-08-31 NOTE — Patient Instructions (Addendum)
Still no anti-inflammatory use until you are 6 weeks post-PRP injection (09/21/17).  Otherwise, you can do easy range of motion exercises that Dr. [name] went over with you.  You can d/c the sling.

## 2017-08-31 NOTE — Progress Notes (Signed)
Casey Kim. Casey Kim, [place] at [place]  Casey Kim - 62 y.o. male MRN [id number]  Date of birth: [birth year]  Visit Date: 08/31/2017  PCP: Dorena Cookey, MD   Referred by: Dorena Cookey, MD  Scribe for today's visit: Wendy Poet, LAT, ATC     SUBJECTIVE:  Casey Kim "Casey Kim" is here for Follow-up (R shoulder and elbow pain) .   07/26/2017: Compared to the last office visit on 11/19/16, his previously described R elbow and and R shoulder symptoms are worsening.  Pt states that he has been splitting a lot of wood from Dec. 2018 - now. Current symptoms are moderate & are radiating to R upper arm.  Does get some N/T when he sleeps on his R side. He has been taking Aleve intermittenlty but is no longer using the nitroglycerin patches and is not doing his HEP currently.  Wants to discuss doing PRP for both his R elbow and shoulder.  He went to see Dr. Merrilyn Puma at [facility] in July 2018 and was going to have a Tenex procedure w/ him but had to cancel the appt and never re-scheduled.  08/10/2017 Underwent PRP   08/31/17: Compared to the last office visit, his previously described R shoulder and elbow pain symptoms are improving w/ less pain Current symptoms are mild & are nonradiating.  He states that he, at times, has some pain into his R upper arm.  He reports some compensatory pain in his R neck/upper trap as well as in his L medial elbow. He has been wearing a sling and doing gentle R shoulder and elbow AAROM.  For the most part he has been wearing his sling while he's up moving around.  He is not sleeping in it.  ROS Denies night time disturbances. Denies fevers, chills, or night sweats. Denies unexplained weight loss. Denies personal history of cancer. Denies changes in bowel or bladder habits. Denies recent unreported falls. Denies new or worsening dyspnea or wheezing. Denies headaches or dizziness.  Reports numbness,  tingling or weakness  In the extremities - intermittently in the R UE and hand Denies dizziness or presyncopal episodes Denies lower extremity edema    HISTORY & PERTINENT PRIOR DATA:  Prior History reviewed and updated per electronic medical record.  Significant/pertinent history, findings, studies include:  reports that he has never smoked. He has quit using smokeless tobacco. No results for input(s): HGBA1C, LABURIC, CREATINE in the last 8760 hours. No specialty comments available. No problems updated.  OBJECTIVE:  VS:  HT:5' 8.5" (174 cm)   WT:197 lb (89.4 kg)  BMI:29.51    BP:130/82  HR:93bpm  TEMP: ( )  RESP:96 %   PHYSICAL EXAM: Constitutional: WDWN, Non-toxic appearing. Psychiatric: Alert & appropriately interactive.  Not depressed or anxious appearing. Respiratory: No increased work of breathing.  Trachea Midline Eyes: Pupils are equal.  EOM intact without nystagmus.  No scleral icterus  Vascular Exam: warm to touch no edema  upper extremity neuro exam: unremarkable  MSK Exam: Shoulder has overall improved clinical range of motion.  He has good intrinsic rotator cuff strength testing.  Pain with palpation of the medial lateral epicondyles.   ASSESSMENT & PLAN:   1. Chronic right shoulder pain   2. Medial epicondylitis of elbow, right   3. Rotator cuff syndrome of right shoulder   4. Somatic dysfunction of upper extremity     PLAN: We will have him continue increasing  his activities as tolerated.  Therapeutic exercises will be deferred until follow-up.  Come out of the sling continue to avoid anti-inflammatories.  Follow-up: Return in about 2 weeks (around 09/14/2017).      Please see additional documentation for Objective, Assessment and Plan sections. Pertinent additional documentation may be included in corresponding procedure notes, imaging studies, problem based documentation and patient instructions. Please see these sections of the encounter for  additional information regarding this visit.  CMA/ATC served as Education administrator during this visit. History, Physical, and Plan performed by medical provider. Documentation and orders reviewed and attested to.      Gerda Diss, [place] [facility] Physician

## 2017-09-09 ENCOUNTER — Encounter: Payer: Self-pay | Admitting: Sports Medicine

## 2017-09-09 NOTE — Procedures (Signed)
PROCEDURE NOTE:  [place] Guided: Injection: Right supraspinatus and medial epicondyle with PRP DESCRIPTION OF PROCEDURE:  The patient's clinical condition is marked by substantial pain and/or significant functional disability. Other conservative therapy has not provided relief, is contraindicated, or not appropriate. There is a reasonable likelihood that injection will significantly improve the patient's pain and/or functional impairment.  After discussing the risks, benefits and expected outcomes of the injection and all questions were reviewed and answered, the patient wished to undergo the above named procedure. Verbal consent was obtained. The skin was then prepped in sterile fashion and the target structure was injected as below:  Peak PRP prep kit was used per minute fracture recommendations to obtain concentrated PRP solution.  Using ultrasound guidance the medial condyle as well as supinates tendon were both visualized under sterile technique using alcohol and ethyl chloride.  Using a 21-gauge 2 inch needle the tendon origins were fenestrated and PRP was injected in equal fashion.  Band-Aids were applied and arm sling was recommended and minimize use was emphasized.

## 2017-09-10 NOTE — Telephone Encounter (Signed)
I'm not able to make recommendations on the CBD oil given a lack of information around canibinoids. They are. It potent natural anti-inflammatory substances so I'm not totally opposed but don't know if it will decrease the efficacy of the PRP

## 2017-09-11 ENCOUNTER — Encounter: Payer: Self-pay | Admitting: Sports Medicine

## 2017-09-14 ENCOUNTER — Ambulatory Visit: Payer: Self-pay

## 2017-09-14 ENCOUNTER — Ambulatory Visit: Payer: Managed Care, Other (non HMO) | Admitting: Sports Medicine

## 2017-09-14 ENCOUNTER — Encounter: Payer: Self-pay | Admitting: Sports Medicine

## 2017-09-14 VITALS — BP 132/84 | HR 77 | Ht 68.5 in | Wt 196.6 lb

## 2017-09-14 DIAGNOSIS — G8929 Other chronic pain: Secondary | ICD-10-CM | POA: Diagnosis not present

## 2017-09-14 DIAGNOSIS — M25511 Pain in right shoulder: Secondary | ICD-10-CM

## 2017-09-14 DIAGNOSIS — M75101 Unspecified rotator cuff tear or rupture of right shoulder, not specified as traumatic: Secondary | ICD-10-CM | POA: Diagnosis not present

## 2017-09-14 DIAGNOSIS — M7701 Medial epicondylitis, right elbow: Secondary | ICD-10-CM | POA: Diagnosis not present

## 2017-09-14 NOTE — Progress Notes (Signed)
PROCEDURE NOTE: THERAPEUTIC EXERCISES (97110) 15 minutes spent for Therapeutic exercises as below and as referenced in the AVS.  This included exercises focusing on stretching, strengthening, with significant focus on eccentric aspects.   Proper technique shown and discussed handout in great detail with ATC.  All questions were discussed and answered.   Long term goals include an improvement in range of motion, strength, endurance as well as avoiding reinjury. Frequency of visits is one time as determined during today's  office visit. Frequency of exercises to be performed is as per handout.  EXERCISES REVIEWED:  Intrinsic Rotator Cuff Exercises  Scapular Stabilization  medial epicondylosis stretching and Hammer Exercises

## 2017-09-14 NOTE — Procedures (Signed)
LIMITED MSK ULTRASOUND OF Right shoulder Images were obtained and interpreted by myself, Teresa Coombs, DO  Images have been saved and stored to PACS system. Images obtained on: GE S7 Ultrasound machine  FINDINGS:   Supraspinatus with significant improvements in the interstitial echotexture with improved fibrillar structure and mild increases in intrasubstance neovascularity.  There is no significant bursal formation.  Medial and lateral epicondyles were reviewed today that did show significant improvements and echotexture with improvements compared to prior imaging. IMPRESSION:  1. Improving supraspinatus tendinopathy status post PRP 5 weeks ago 2. Improving medial epicondylosis status post PRP injection 5 weeks ago.

## 2017-09-14 NOTE — Progress Notes (Signed)
[name], [place] at [place]  NORMA IGNASIAK - 62 y.o. male MRN [id number]  Date of birth: 05-21-1955  Visit Date: 09/14/2017  PCP: Dorena Cookey, MD   Referred by: Dorena Cookey, MD  Scribe for today's visit: Josepha Pigg, CMA     SUBJECTIVE:  EDRIS FRIEDT "[name]" is here for Follow-up (R shoulder pain)  07/26/2017: Compared to the last office visit on 11/19/16, his previously described R elbow and and R shoulder symptoms are worsening.  Pt states that he has been splitting a lot of wood from Dec. 2018 - now. Current symptoms are moderate & are radiating to R upper arm.  Does get some N/T when he sleeps on his R side. He has been taking Aleve intermittenlty but is no longer using the nitroglycerin patches and is not doing his HEP currently.  Wants to discuss doing PRP for both his R elbow and shoulder.  He went to see Dr. Merrilyn Puma at [facility] in July 2018 and was going to have a Tenex procedure w/ him but had to cancel the appt and never re-scheduled.  08/10/2017 Underwent PRP   08/31/17: Compared to the last office visit, his previously described R shoulder and elbow pain symptoms are improving w/ less pain Current symptoms are mild & are nonradiating.  He states that he, at times, has some pain into his R upper arm.  He reports some compensatory pain in his R neck/upper trap as well as in his L medial elbow. He has been wearing a sling and doing gentle R shoulder and elbow AAROM.  For the most part he has been wearing his sling while he's up moving around.  He is not sleeping in it.  09/14/2017: Compared to the last office visit, his previously described symptoms are improving. He does have occasional short term flare-up.  Current symptoms are mild & are radiating to L arm and elbow.  He has been doing home exercises with no trouble. He working on ROM with 1 lb weights.  He is not longer wearing sling.   He had  OMT at his last visit. He noticed improvement in neck pain initially but after a few days the pain returned.   ROS Reports night time disturbances. Denies fevers, chills, or night sweats. Denies unexplained weight loss. Denies personal history of cancer. Denies changes in bowel or bladder habits. Denies recent unreported falls. Denies new or worsening dyspnea or wheezing. Denies headaches or dizziness.  Reports numbness, tingling or weakness  In the extremities.  Denies dizziness or presyncopal episodes Denies lower extremity edema    HISTORY & PERTINENT PRIOR DATA:  Prior History reviewed and updated per electronic medical record.  Significant/pertinent history, findings, studies include:  reports that he has never smoked. He has quit using smokeless tobacco. No results for input(s): HGBA1C, LABURIC, CREATINE in the last 8760 hours. No specialty comments available. No problems updated.  OBJECTIVE:  VS:  HT:5' 8.5" (174 cm)   WT:196 lb 9.6 oz (89.2 kg)  BMI:29.45    BP:132/84  HR:77bpm  TEMP: ( )  RESP:96 %   PHYSICAL EXAM: Constitutional: WDWN, Non-toxic appearing. Psychiatric: Alert & appropriately interactive.  Not depressed or anxious appearing. Respiratory: No increased work of breathing.  Trachea Midline Eyes: Pupils are equal.  EOM intact without nystagmus.  No scleral icterus  Vascular Exam: warm to touch no edema  upper extremity neuro exam: unremarkable  MSK Exam:  Markedly improved overhead range of motion of the shoulder.  He gets almost to full abduction and forward flexion before having any symptoms of impingement.  Negative Hawkins and Neer's.  Internal rotation and external rotation, empty can and speeds testing strength is 4/5 at this time.   ASSESSMENT & PLAN:   1. Chronic right shoulder pain   2. Medial epicondylitis of elbow, right   3. Rotator cuff syndrome of right shoulder     PLAN: Increase his physical activity level recommended at this  time.  Should continue to remain diligent about avoiding any type of explosive activities or heavy lifting/carrying.  Doing quite well following PRP.  Discussed the foundation of treatment for this condition is physical therapy and/or daily (5-6 days/week) therapeutic exercises, focusing on core strengthening, coordination, neuromuscular control/reeducation.  Therapeutic exercises prescribed per procedure note.  Follow-up: Return in about 5 weeks (around 10/19/2017).      Please see additional documentation for Objective, Assessment and Plan sections. Pertinent additional documentation may be included in corresponding procedure notes, imaging studies, problem based documentation and patient instructions. Please see these sections of the encounter for additional information regarding this visit.  CMA/ATC served as Education administrator during this visit. History, Physical, and Plan performed by medical provider. Documentation and orders reviewed and attested to.      Gerda Diss, [place] [facility] Physician

## 2017-09-14 NOTE — Patient Instructions (Addendum)
Please perform the exercise program that we have prepared for you and gone over in detail on a daily basis.  In addition to the handout you were provided you can access your program through: [email or web address]   Your unique program code is: [email or web address]

## 2017-09-24 ENCOUNTER — Other Ambulatory Visit: Payer: Self-pay | Admitting: Family Medicine

## 2017-10-19 ENCOUNTER — Ambulatory Visit: Payer: Managed Care, Other (non HMO) | Admitting: Physician Assistant

## 2017-10-19 ENCOUNTER — Ambulatory Visit: Payer: Self-pay

## 2017-10-19 ENCOUNTER — Ambulatory Visit: Payer: Managed Care, Other (non HMO) | Admitting: Sports Medicine

## 2017-10-19 ENCOUNTER — Encounter: Payer: Self-pay | Admitting: Sports Medicine

## 2017-10-19 ENCOUNTER — Encounter: Payer: Self-pay | Admitting: Physician Assistant

## 2017-10-19 VITALS — BP 148/90 | HR 83 | Ht 68.5 in | Wt 191.0 lb

## 2017-10-19 VITALS — BP 150/98 | HR 88 | Temp 98.6°F | Ht 68.5 in | Wt 191.0 lb

## 2017-10-19 DIAGNOSIS — J069 Acute upper respiratory infection, unspecified: Secondary | ICD-10-CM | POA: Diagnosis not present

## 2017-10-19 DIAGNOSIS — M7701 Medial epicondylitis, right elbow: Secondary | ICD-10-CM

## 2017-10-19 DIAGNOSIS — M25511 Pain in right shoulder: Secondary | ICD-10-CM

## 2017-10-19 DIAGNOSIS — R030 Elevated blood-pressure reading, without diagnosis of hypertension: Secondary | ICD-10-CM

## 2017-10-19 DIAGNOSIS — M75101 Unspecified rotator cuff tear or rupture of right shoulder, not specified as traumatic: Secondary | ICD-10-CM | POA: Diagnosis not present

## 2017-10-19 DIAGNOSIS — G8929 Other chronic pain: Secondary | ICD-10-CM

## 2017-10-19 MED ORDER — BENZONATATE 100 MG PO CAPS: 100.0000 mg | ORAL_CAPSULE | Freq: Three times a day (TID) | ORAL | 1 refills | Status: DC | PRN

## 2017-10-19 MED ORDER — DOXYCYCLINE HYCLATE 100 MG PO TABS: 100.0000 mg | ORAL_TABLET | Freq: Two times a day (BID) | ORAL | 0 refills | Status: DC

## 2017-10-19 NOTE — Progress Notes (Signed)
Casey Kim is a 62 y.o. male here for a new problem.  I acted as a Education administrator for [employer], PA-C Anselmo Pickler, LPN   History of Present Illness:   Chief Complaint  Patient presents with  . Cough    Cough  This is a new problem. Episode onset: Started 3 weeks ago. The problem has been gradually worsening. The problem occurs every few hours. The cough is productive of sputum (expectorating yellow/ green sputum). Associated symptoms include nasal congestion, postnasal drip and wheezing. Pertinent negatives include no chest pain, chills, ear congestion, ear pain, fever, headaches, sore throat or shortness of breath. Treatments tried: Dayquil and Nyquil. The treatment provided mild relief. There is no history of asthma, bronchitis or pneumonia.   Appetite is good.   Elevated blood pressures Went to sports medicine today. At home blood pressure readings are: normal per his report. Patient denies chest pain, SOB, blurred vision, dizziness, unusual headaches, lower leg swelling. Denies excessive caffeine intake, stimulant usage, excessive alcohol intake, or increase in salt consumption. Denies recent NSAID use. Last physical was 08/12/16 -- he is overdue for CPE.  BP Readings from Last 3 Encounters:  10/19/17 (!) 150/98  10/19/17 (!) 148/90  09/14/17 132/84     Past Medical History:  Diagnosis Date  . Allergic rhinitis   . Diverticulosis 1988  . Hearing loss   . Hyperlipidemia   . Restless leg syndrome      Social History   Socioeconomic History  . Marital status: Married    Spouse name: Not on file  . Number of children: 2  . Years of education: Not on file  . Highest education level: Not on file  Occupational History  . 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  Social Needs  . Financial resource strain: Not on file  . Food insecurity:    Worry: Not on file    Inability: Not on file  . Transportation needs:    Medical: Not on file   Non-medical: Not on file  Tobacco Use  . Smoking status: Never Smoker  . Smokeless tobacco: Former Network engineer and Sexual Activity  . Alcohol use: Yes    Comment: 2-3 beers a day  . Drug use: No    Comment: marijuana weekly  . Sexual activity: Not on file  Lifestyle  . Physical activity:    Days per week: Not on file    Minutes per session: Not on file  . Stress: Not on file  Relationships  . Social connections:    Talks on phone: Not on file    Gets together: Not on file    Attends religious service: Not on file    Active member of club or organization: Not on file    Attends meetings of clubs or organizations: Not on file    Relationship status: Not on file  . Intimate partner violence:    Fear of current or ex partner: Not on file    Emotionally abused: Not on file    Physically abused: Not on file    Forced sexual activity: Not on file  Other Topics Concern  . Not on file  Social History Narrative  . Not on file    Past Surgical History:  Procedure Laterality Date  . COLONOSCOPY    . MENISCUS REPAIR Right   . PARTIAL COLECTOMY  1998   for severe diverticulitis   . Rhinoseptoplasty    . TONSILLECTOMY  Family History  Problem Relation Age of Onset  . Diabetes Father   . Heart failure Father   . Lung cancer Mother   . Diabetes Brother     Allergies  Allergen Reactions  . Fish Oil     REACTION: rash    Current Medications:   Current Outpatient Medications:  .  acyclovir (ZOVIRAX) 400 MG tablet, Take 1 tablet (400 mg total) by mouth 3 (three) times daily., Disp: 30 tablet, Rfl: 4 .  aspirin 81 MG tablet, Take 81 mg by mouth daily., Disp: , Rfl:  .  Barberry-Oreg Grape-Goldenseal (BERBERINE COMPLEX PO), Take 1,000 mg by mouth daily., Disp: , Rfl:  .  CANNABIDIOL PO, Take by mouth., Disp: , Rfl:  .  omeprazole (PRILOSEC) 10 MG capsule, Take 1 capsule (10 mg total) by mouth daily., Disp: 100 capsule, Rfl: 4 .  OVER THE COUNTER MEDICATION, , Disp:  , Rfl:  .  rOPINIRole (REQUIP) 4 MG tablet, TAKE 1 TABLET BY MOUTH AT BEDTIME, Disp: 100 tablet, Rfl: 4 .  simvastatin (ZOCOR) 20 MG tablet, Take 1 tablet (20 mg total) by mouth daily., Disp: 90 tablet, Rfl: 4 .  benzonatate (TESSALON) 100 MG capsule, Take 1 capsule (100 mg total) by mouth 3 (three) times daily as needed for cough., Disp: 20 capsule, Rfl: 1 .  doxycycline (VIBRA-TABS) 100 MG tablet, Take 1 tablet (100 mg total) by mouth 2 (two) times daily., Disp: 20 tablet, Rfl: 0   Review of Systems:   Review of Systems  Constitutional: Negative for chills and fever.  HENT: Positive for postnasal drip. Negative for ear pain and sore throat.   Respiratory: Positive for cough and wheezing. Negative for shortness of breath.   Cardiovascular: Negative for chest pain.  Neurological: Negative for headaches.    Vitals:   Vitals:   10/19/17 1024  BP: (!) 150/98  Pulse: 88  Temp: 98.6 F (37 C)  TempSrc: Oral  SpO2: 96%  Weight: 191 lb (86.6 kg)  Height: 5' 8.5" (1.74 m)     Body mass index is 28.62 kg/m.  Physical Exam:   Physical Exam  Constitutional: He appears well-developed. He is cooperative.  Non-toxic appearance. He does not have a sickly appearance. He does not appear ill. No distress.  HENT:  Head: Normocephalic and atraumatic.  Right Ear: Tympanic membrane, external ear and ear canal normal. Tympanic membrane is not erythematous, not retracted and not bulging.  Left Ear: Tympanic membrane, external ear and ear canal normal. Tympanic membrane is not erythematous, not retracted and not bulging.  Nose: Mucosal edema and rhinorrhea present. Right sinus exhibits no maxillary sinus tenderness and no frontal sinus tenderness. Left sinus exhibits no maxillary sinus tenderness and no frontal sinus tenderness.  Mouth/Throat: Uvula is midline and mucous membranes are normal. No posterior oropharyngeal edema or posterior oropharyngeal erythema. Tonsils are 1+ on the right. Tonsils  are 1+ on the left. No tonsillar exudate.  Eyes: Conjunctivae and lids are normal.  Neck: Trachea normal.  Cardiovascular: Normal rate, regular rhythm, S1 normal, S2 normal and normal heart sounds.  Pulmonary/Chest: Effort normal and breath sounds normal. He has no decreased breath sounds. He has no wheezes. He has no rhonchi. He has no rales.  Lungs clear to auscultation bilaterally  Lymphadenopathy:    He has cervical adenopathy.  Neurological: He is alert.  Skin: Skin is warm, dry and intact.  Psychiatric: He has a normal mood and affect. His speech is normal and behavior is normal.  Nursing note and vitals reviewed.   Assessment and Plan:    Gaspar was seen today for cough.  Diagnoses and all orders for this visit:  Upper respiratory tract infection, unspecified type No red flags on exam.  Given duration of symptoms, will initiate doxycycline per orders. Will also treaet with Tessalon per orders. Discussed taking medications as prescribed. Reviewed return precautions including worsening fever, SOB, worsening cough or other concerns. Push fluids and rest. I recommend that patient follow-up if symptoms worsen or persist despite treatment x 7-10 days, sooner if needed.  Elevated blood pressure reading Asymptomatic. Patient reports that his BP was normal this weekend. I recommended patient keep an eye on blood pressure - if consistently >140/90, please let us know.  Other orders -     doxycycline (VIBRA-TABS) 100 MG tablet; Take 1 tablet (100 mg total) by mouth 2 (two) times daily. -     benzonatate (TESSALON) 100 MG capsule; Take 1 capsule (100 mg total) by mouth 3 (three) times daily as needed for cough.    . Reviewed expectations re: course of current medical issues. . Discussed self-management of symptoms. . Outlined signs and symptoms indicating need for more acute intervention. . Patient verbalized understanding and all questions were answered. . See orders for this visit  as documented in the electronic medical record. . Patient received an After-Visit Summary.   Inda Coke, PA-C

## 2017-10-19 NOTE — Progress Notes (Signed)
Casey Kim, [place] at [place]  TARANCE BALAN - 62 y.o. male MRN [id number]  Date of birth: 20-May-1955  Visit Date: 10/19/2017  PCP: Dorena Cookey, MD   Referred by: Dorena Cookey, MD  Scribe(s) for today's visit: Casey Kim, LAT, ATC  SUBJECTIVE:  Casey Kim "Casey Kim" is here for Follow-up (R shoulder and R elbow pain) .    07/26/2017: Compared to the last office visit on 11/19/16, his previously described R elbow and and R shoulder symptoms are worsening.  Pt states that he has been splitting a lot of wood from Dec. 2018 - now. Current symptoms are moderate & are radiating to R upper arm.  Does get some N/T when he sleeps on his R side. He has been taking Aleve intermittenlty but is no longer using the nitroglycerin patches and is not doing his HEP currently.  Wants to discuss doing PRP for both his R elbow and shoulder.  He went to see Dr. Merrilyn Puma at [facility] in July 2018 and was going to have a Tenex procedure w/ him but had to cancel the appt and never re-scheduled.  08/10/2017 Underwent PRP   08/31/17: Compared to the last office visit, his previously described R shoulder and elbow pain symptoms are improving w/ less pain Current symptoms are mild & are nonradiating.  He states that he, at times, has some pain into his R upper arm.  He reports some compensatory pain in his R neck/upper trap as well as in his L medial elbow. He has been wearing a sling and doing gentle R shoulder and elbow AAROM.  For the most part he has been wearing his sling while he's up moving around.  He is not sleeping in it.  09/14/2017: Compared to the last office visit, his previously described symptoms are improving. He does have occasional short term flare-up.  Current symptoms are mild & are radiating to L arm and elbow.  He has been doing home exercises with no trouble. He working on ROM with 1 lb weights.  He is not longer wearing  sling.   He had OMT at his last visit. He noticed improvement in neck pain initially but after a few days the pain returned.   10/19/17: Compared to the last office visit on 09/14/17, his previously described R shoulder and elbow pain symptoms are improving.  He will still have random pains in his R shoulder and R elbow. Current symptoms are mild & are nonradiating He has been doing his HEP consisting of gentle strengthening exercises w/ dumbbells.   REVIEW OF SYSTEMS: Reports night time disturbances. Denies fevers, chills, or night sweats. Denies unexplained weight loss. Denies personal history of cancer. Denies changes in bowel or bladder habits. Denies recent unreported falls. Denies new or worsening dyspnea or wheezing. Denies headaches or dizziness.  Denies numbness, tingling or weakness  In the extremities.  Denies dizziness or presyncopal episodes Denies lower extremity edema    HISTORY & PERTINENT PRIOR DATA:  Prior History reviewed and updated per electronic medical record.  Significant/pertinent history, findings, studies include:  reports that he has never smoked. He has quit using smokeless tobacco. No results for input(s): HGBA1C, LABURIC, CREATINE in the last 8760 hours. No specialty comments available. No problems updated.  OBJECTIVE:  VS:  HT:5' 8.5" (174 cm)   WT:191 lb (86.6 kg)  BMI:28.62    BP:(Abnormal) 148/90  HR:83bpm  TEMP: ( )  RESP:96 %   PHYSICAL EXAM: CONSTITUTIONAL: Well-developed, Well-nourished and In no acute distress Alert & appropriately interactive. and Not depressed or anxious appearing. Respiratory: No increased work of breathing.  Trachea Midline EYES: Pupils are equal., EOM intact without nystagmus. and No scleral icterus.  Upper extremities: Warm and well perfused NEURO: unremarkable  MSK Exam: Right shoulder: . Well aligned, no significant deformity. . No overlying skin changes. . No focal bony  tenderness . Internal/external rotation strength is significantly improved compared to previous.  His empty can test is also significantly stronger although still slightly weak compared to contralateral left side. Marland Kitchen Negative Hawkins and Neer's  Right elbow: Improved tenderness over the common extensor and common flexor tendons.  PROCEDURES & DATA REVIEWED:  none  ASSESSMENT   1. Chronic right shoulder pain   2. Medial epicondylitis of elbow, right   3. Rotator cuff syndrome of right shoulder     PLAN:  He is doing quite well following PRP injections.  We will have him return to activities as tolerated continue with his home therapeutic exercises.  He is happy with his progress reports was complete resolution of his elbow pain but does still have some mild residual rotator cuff tendinopathy and achiness but is quite content with how well he is doing.  If any worsening features he will plan to schedule follow-up appointment at his convenience.  No problem-specific Assessment & Plan notes found for this encounter.  Orders Placed This Encounter  Procedures  . [place] MSK POCT ULTRASOUND  No orders of the defined types were placed in this encounter.    Follow-up: Return if symptoms worsen or fail to improve, for as needed for ongoing issues.      Please see additional documentation for Objective, Assessment and Plan sections. Pertinent additional documentation may be included in corresponding procedure notes, imaging studies, problem based documentation and patient instructions. Please see these sections of the encounter for additional information regarding this visit.  CMA/ATC served as Education administrator during this visit. History, Physical, and Plan performed by medical provider. Documentation and orders reviewed and attested to.      Gerda Diss, [facility] Physician

## 2017-10-19 NOTE — Procedures (Signed)
LIMITED MSK ULTRASOUND OF Right shoulder, elbow Images were obtained and interpreted by myself, Teresa Coombs, DO  Images have been saved and stored to PACS system. Images obtained on: GE S7 Ultrasound machine  FINDINGS:   Right shoulder limited views show improved interstitial findings of the supraspinatus with still persistent thickening and mild increased neovascularity  Elbow has overall marked improvements in the fibrillar structure of the common extensor tendon.  IMPRESSION:  1. Healing supraspinatus tendinopathy and healing epicondylosis status post PRP injections

## 2017-10-19 NOTE — Patient Instructions (Signed)
It was great to see you.  Use medication as prescribed: Doxycycline and Tessalon perles  Push fluids and get plenty of rest. Please return if you are not improving as expected, or if you have high fevers (>101.5) or difficulty swallowing or worsening productive cough.  Call clinic with questions.  I hope you start feeling better soon!  Keep an eye on blood pressure - if consistently >140/90, please let the office know. You are overdue for a physical, please schedule one at your convenience.

## 2017-12-19 ENCOUNTER — Encounter: Payer: Self-pay | Admitting: Sports Medicine

## 2018-01-05 ENCOUNTER — Encounter: Payer: Self-pay | Admitting: Family Medicine

## 2018-01-05 ENCOUNTER — Ambulatory Visit (INDEPENDENT_AMBULATORY_CARE_PROVIDER_SITE_OTHER): Payer: Managed Care, Other (non HMO) | Admitting: Family Medicine

## 2018-01-05 VITALS — BP 120/82 | HR 83 | Temp 98.7°F | Ht 68.5 in | Wt 192.6 lb

## 2018-01-05 DIAGNOSIS — Z23 Encounter for immunization: Secondary | ICD-10-CM | POA: Diagnosis not present

## 2018-01-05 DIAGNOSIS — E785 Hyperlipidemia, unspecified: Secondary | ICD-10-CM

## 2018-01-05 DIAGNOSIS — R030 Elevated blood-pressure reading, without diagnosis of hypertension: Secondary | ICD-10-CM

## 2018-01-05 DIAGNOSIS — Z125 Encounter for screening for malignant neoplasm of prostate: Secondary | ICD-10-CM | POA: Diagnosis not present

## 2018-01-05 DIAGNOSIS — G2581 Restless legs syndrome: Secondary | ICD-10-CM

## 2018-01-05 DIAGNOSIS — Z0000 Encounter for general adult medical examination without abnormal findings: Secondary | ICD-10-CM

## 2018-01-05 DIAGNOSIS — J301 Allergic rhinitis due to pollen: Secondary | ICD-10-CM | POA: Diagnosis not present

## 2018-01-05 LAB — CBC WITH DIFFERENTIAL/PLATELET
BASOS ABS: 0.3 10*3/uL — AB (ref 0.0–0.1)
BASOS PCT: 4.4 % — AB (ref 0.0–3.0)
EOS ABS: 0.1 10*3/uL (ref 0.0–0.7)
Eosinophils Relative: 1.9 % (ref 0.0–5.0)
HCT: 45.8 % (ref 39.0–52.0)
Hemoglobin: 16 g/dL (ref 13.0–17.0)
LYMPHS ABS: 0.6 10*3/uL — AB (ref 0.7–4.0)
Lymphocytes Relative: 7.9 % — ABNORMAL LOW (ref 12.0–46.0)
MCHC: 35 g/dL (ref 30.0–36.0)
MCV: 98 fl (ref 78.0–100.0)
MONO ABS: 0.4 10*3/uL (ref 0.1–1.0)
Monocytes Relative: 5 % (ref 3.0–12.0)
NEUTROS ABS: 6.3 10*3/uL (ref 1.4–7.7)
NEUTROS PCT: 80.8 % — AB (ref 43.0–77.0)
PLATELETS: 219 10*3/uL (ref 150.0–400.0)
RBC: 4.67 Mil/uL (ref 4.22–5.81)
RDW: 13.2 % (ref 11.5–15.5)
WBC: 7.8 10*3/uL (ref 4.0–10.5)

## 2018-01-05 LAB — POCT URINALYSIS DIPSTICK
BILIRUBIN UA: NEGATIVE
Blood, UA: NEGATIVE
Glucose, UA: NEGATIVE
KETONES UA: NEGATIVE
Leukocytes, UA: NEGATIVE
Nitrite, UA: NEGATIVE
PH UA: 5.5 (ref 5.0–8.0)
Protein, UA: POSITIVE — AB
UROBILINOGEN UA: 0.2 U/dL

## 2018-01-05 LAB — BASIC METABOLIC PANEL
BUN: 11 mg/dL (ref 6–23)
CHLORIDE: 105 meq/L (ref 96–112)
CO2: 28 meq/L (ref 19–32)
CREATININE: 0.91 mg/dL (ref 0.40–1.50)
Calcium: 9.4 mg/dL (ref 8.4–10.5)
GFR: 89.13 mL/min (ref >60.00)
Glucose, Bld: 98 mg/dL (ref 70–99)
POTASSIUM: 4.7 meq/L (ref 3.5–5.1)
Sodium: 141 mEq/L (ref 135–145)

## 2018-01-05 LAB — HEPATIC FUNCTION PANEL
ALBUMIN: 4.4 g/dL (ref 3.5–5.2)
ALK PHOS: 44 U/L (ref 39–117)
ALT: 18 U/L (ref 0–53)
AST: 16 U/L (ref 0–37)
BILIRUBIN DIRECT: 0.1 mg/dL (ref 0.0–0.3)
TOTAL PROTEIN: 7 g/dL (ref 6.0–8.3)
Total Bilirubin: 0.8 mg/dL (ref 0.2–1.2)

## 2018-01-05 LAB — LIPID PANEL
CHOLESTEROL: 187 mg/dL (ref 0–200)
HDL: 49.1 mg/dL (ref >39.00)
LDL Cholesterol: 120 mg/dL — ABNORMAL HIGH (ref 0–99)
NonHDL: 137.52
TRIGLYCERIDES: 87 mg/dL (ref 0.0–149.0)
Total CHOL/HDL Ratio: 4
VLDL: 17.4 mg/dL (ref 0.0–40.0)

## 2018-01-05 LAB — TSH: TSH: 0.82 u[IU]/mL (ref 0.35–4.50)

## 2018-01-05 LAB — PSA: PSA: 0.9 ng/mL (ref 0.10–4.00)

## 2018-01-05 MED ORDER — ROPINIROLE HCL 4 MG PO TABS: 4.0000 mg | ORAL_TABLET | Freq: Every day | ORAL | 4 refills | Status: DC

## 2018-01-05 MED ORDER — ACYCLOVIR 400 MG PO TABS: 400.0000 mg | ORAL_TABLET | Freq: Three times a day (TID) | ORAL | 4 refills | Status: AC

## 2018-01-05 MED ORDER — LISINOPRIL-HYDROCHLOROTHIAZIDE 10-12.5 MG PO TABS: 1.0000 | ORAL_TABLET | Freq: Every day | ORAL | 3 refills | Status: DC

## 2018-01-05 MED ORDER — ATORVASTATIN CALCIUM 20 MG PO TABS: 20.0000 mg | ORAL_TABLET | Freq: Every day | ORAL | 3 refills | Status: AC

## 2018-01-05 NOTE — Patient Instructions (Addendum)
Zestoretic 10-12 0.5............. One tablet daily in the morning  Check your blood pressure morning and evening  Return September 30 for follow-up........Marland Kitchenbring a copy of all your blood pressure readings when you return and the device  Continue diet and exercise program  Labs today............ I will cauterize is anything abnormal  Lipitor 20 mg........... One daily at bedtime with an aspirin tablet 81 mg........ Follow-up lipid and liver panel in 2 months

## 2018-01-05 NOTE — Progress Notes (Signed)
Casey Kim is a 62 year old married male nonsmoker who comes in today for general physical examination  He's always been in excellent health except for the following problems  He's had a history of hyperlipidemia. We put him on Zocor 20 mg daily. He stopped it about a year ago because he said it didn't seem to be lowering his cholesterol. We discussed increasing the dose until was lipids are back to normal not stopping the medication. We'll try again  He takes acyclovir 400 mg daily when necessary  He takes Prilosec 10 mg daily when necessary  He's been monitoring his blood pressure. Blood pressure readings are averaging 140/90. BP here today 150/80 right arm sitting position with both cuffs.  He recently had PRP via Dr. [name] for torn rotator cuff. He also had pain in his elbow. Both of these conditions have resolved and is completely happy.  He also has some mild allergic rhinitis and uses worse in the fall. He takes plain Allegra 100 milk 80 mg daily He gets routine eye care, dental care, colonoscopy 2015 was normalVaccinations tetanus booster 2015 seasonal flu shot today. He said the new shingles vaccines 2.  Family history unchanged  Social history married lives here in [place] retired walks 5 miles 5 days a week and starting to play golf again.  14 point review of systems reviewed and otherwise negative  BP 120/82 (BP Location: Left Arm, Patient Position: Sitting, Cuff Size: Normal)   Pulse 83   Temp 98.7 F (37.1 C) (Oral)   Ht 5' 8.5" (1.74 m)   Wt 192 lb 9.6 oz (87.4 kg)   BMI 28.86 kg/m  Well-developed well-nourished male no acute distress vital signs stable he is afebrile HEENT were negative except he has bilateral hearing aids. Neck was supple thyroid is not large no carotid bruits. Cardiopulmonary exam normal abdominal exam normal genitalia normal circumcised male rectal normal stool guaiac-negative prostate smooth nonnodular 1+ BPH. Extremities normal skin normal peripheral  pulses normal  #1 hypertension........ Start Zestoretic...Marland KitchenMarland KitchenMarland Kitchen BP check daily..... Follow-up in 3 weeks  #2 allergic rhinitis........ Plain Allegra  #3 BPH mild........ Check PSA  Number for restless leg syndrome continue Requip 4 mg daily at bedtime

## 2018-01-10 ENCOUNTER — Ambulatory Visit: Payer: Managed Care, Other (non HMO) | Admitting: Sports Medicine

## 2018-01-10 ENCOUNTER — Encounter: Payer: Self-pay | Admitting: Sports Medicine

## 2018-01-10 VITALS — BP 130/78 | HR 83 | Ht 68.5 in | Wt 189.2 lb

## 2018-01-10 DIAGNOSIS — M25511 Pain in right shoulder: Secondary | ICD-10-CM | POA: Diagnosis not present

## 2018-01-10 DIAGNOSIS — M25551 Pain in right hip: Secondary | ICD-10-CM | POA: Diagnosis not present

## 2018-01-10 NOTE — Patient Instructions (Signed)

## 2018-01-10 NOTE — Progress Notes (Signed)
[name]. [place], [place] at [place]  HALDON CARLEY - 62 y.o. male MRN [id number]  Date of birth: 1955-09-24  Visit Date: 01/10/2018  PCP: Dorena Cookey, MD   Referred by: Dorena Cookey, MD  Scribe(s) for today's visit: Josepha Pigg, CMA  SUBJECTIVE:  [name] "[name]" is here for No chief complaint on file.  07/26/2017: Compared to the last office visit on 11/19/16, his previously described R elbow and and R shoulder symptoms are worsening.  Pt states that he has been splitting a lot of wood from Dec. 2018 - now. Current symptoms are moderate & are radiating to R upper arm.  Does get some N/T when he sleeps on his R side. He has been taking Aleve intermittenlty but is no longer using the nitroglycerin patches and is not doing his HEP currently.  Wants to discuss doing PRP for both his R elbow and shoulder.  He went to see Dr. Merrilyn Puma at [facility] in July 2018 and was going to have a Tenex procedure w/ him but had to cancel the appt and never re-scheduled.  08/10/2017 Underwent PRP   08/31/17: Compared to the last office visit, his previously described R shoulder and elbow pain symptoms are improving w/ less pain Current symptoms are mild & are nonradiating.  He states that he, at times, has some pain into his R upper arm.  He reports some compensatory pain in his R neck/upper trap as well as in his L medial elbow. He has been wearing a sling and doing gentle R shoulder and elbow AAROM.  For the most part he has been wearing his sling while he's up moving around.  He is not sleeping in it.  09/14/2017: Compared to the last office visit, his previously described symptoms are improving. He does have occasional short term flare-up.  Current symptoms are mild & are radiating to L arm and elbow.  He has been doing home exercises with no trouble. He working on ROM with 1 lb weights.  He is not longer wearing sling.  He had OMT  at his last visit. He noticed improvement in neck pain initially but after a few days the pain returned.   10/19/17: Compared to the last office visit on 09/14/17, his previously described R shoulder and elbow pain symptoms are improving.  He will still have random pains in his R shoulder and R elbow. Current symptoms are mild & are nonradiating He has been doing his HEP consisting of gentle strengthening exercises w/ dumbbells.  01/10/2018: R shoulder Compared to the last office visit, his previously described symptoms are improving.  He states he has good range of motion.  He likes to sleep on his right side. Current symptoms are mild & are radiating to the left upper arm.  He states he feels as though he has a strained muscle around the left clavicle area. He has been stretching and staying active by walking and playing golf.  R hip pain Patient states he is having pain in his right hip when pressing on the hip joint.  Comes and goes.  Has become more constant over the last 3 weeks.  Has begun to have some irritation with walking.  Also has pain when lying on his right side.   REVIEW OF SYSTEMS: Denies night time disturbances. Denies fevers, chills, or night sweats. Denies unexplained weight loss. Denies personal history of cancer. Denies changes in bowel or  bladder habits. Denies recent unreported falls. Denies new or worsening dyspnea or wheezing. Denies headaches or dizziness.  Denies numbness, tingling or weakness  In the extremities.  Denies dizziness or presyncopal episodes Denies lower extremity edema    HISTORY:  Prior history reviewed and updated per electronic medical record.  Social History   Occupational History  . 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  Tobacco Use  . Smoking status: Never Smoker  . Smokeless tobacco: Former Network engineer and Sexual Activity  . Alcohol use: Yes    Comment: 2-3 beers a day  . Drug use: No    Comment:  marijuana weekly  . Sexual activity: Not on file   Social History   Social History Narrative  . Not on file     DATA OBTAINED & REVIEWED:  No results for input(s): HGBA1C, LABURIC, CREATINE in the last 8760 hours.   OBJECTIVE:  VS:  HT:5' 8.5" (174 cm)   WT:189 lb 3.2 oz (85.8 kg)  BMI:28.35    BP:130/78  HR:83bpm  TEMP: ( )  RESP:96 %   PHYSICAL EXAM: CONSTITUTIONAL: Well-developed, Well-nourished and In no acute distress PSYCHIATRIC: Alert & appropriately interactive. and Not depressed or anxious appearing. RESPIRATORY: No increased work of breathing and Trachea Midline EYES: Pupils are equal., EOM intact without nystagmus. and No scleral icterus.  VASCULAR EXAM: Warm and well perfused NEURO: unremarkable  MSK Exam: Bilateral shoulder  Well aligned, no significant deformity. No overlying skin changes. No focal bony tenderness   RANGE OF MOTION & STRENGTH  Normal, non-painful Internal and external range of motion.   SPECIALITY TESTING:  Empty can testing is normal.  He has negative Hawkins negative Neer's.         Bilateral hips and back are overall well aligned without significant deformity.  Negative straight leg raise.  Good internal and external rotation of bilateral hips.  He has pain with palpation over the greater trochanter but this is minimal.  No radicular symptoms.  Generalized TTP over the gluteal musculature.   ASSESSMENT   1. Right hip pain   2. Right shoulder pain, unspecified chronicity     PLAN:  Pertinent additional documentation may be included in corresponding procedure notes, imaging studies, problem based documentation and patient instructions.  Procedures:  . None  Medications:  No orders of the defined types were placed in this encounter.   Discussion/Instructions: .  Links to [employer] provided today per Patient Instructions.  These exercises were developed by Minerva Ends, DC with a strong emphasis on core  neuromuscular reducation and postural realignment through body-weight exercises. . Continue previously prescribed home exercise program.  . Symptoms are consistent with improving rotator cuff tear and hip flexor tightness leading to generalized low back and gluteal pain. . Discussed red flag symptoms that warrant earlier emergent evaluation and patient voices understanding. . Activity modifications and the importance of avoiding exacerbating activities (limiting pain to no more than a 4 / 10 during or following activity) recommended and discussed. . >50% of this 25 minutes minute visit spent in direct patient counseling and/or coordination of care. Discussion was focused on education regarding the in discussing the pathoetiology and anticipated clinical course of the above condition.   Follow-up:  . Return in about 4 weeks (around 02/07/2018).  . If any lack of improvement consider: . further diagnostic evaluation with Plain film x-rays of the lumbar spine and/or hips     CMA/ATC served as scribe  during this visit. History, Physical, and Plan performed by medical provider. Documentation and orders reviewed and attested to.      Gerda Diss, [place] Physician

## 2018-01-15 ENCOUNTER — Encounter: Payer: Self-pay | Admitting: Sports Medicine

## 2018-01-19 ENCOUNTER — Ambulatory Visit: Payer: Managed Care, Other (non HMO) | Admitting: Family Medicine

## 2018-01-19 ENCOUNTER — Other Ambulatory Visit: Payer: Self-pay

## 2018-01-19 ENCOUNTER — Encounter: Payer: Self-pay | Admitting: Family Medicine

## 2018-01-19 VITALS — BP 138/82 | HR 81 | Temp 98.2°F | Resp 16 | Ht 68.5 in | Wt 191.0 lb

## 2018-01-19 DIAGNOSIS — H1131 Conjunctival hemorrhage, right eye: Secondary | ICD-10-CM

## 2018-01-19 MED ORDER — POLYMYXIN B-TRIMETHOPRIM 10000-0.1 UNIT/ML-% OP SOLN: 2.0000 [drp] | OPHTHALMIC | 0 refills | Status: DC
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
## 2018-01-19 NOTE — Patient Instructions (Signed)

## 2018-01-31 ENCOUNTER — Encounter: Payer: Self-pay | Admitting: Family Medicine

## 2018-01-31 ENCOUNTER — Ambulatory Visit: Payer: Managed Care, Other (non HMO) | Admitting: Family Medicine

## 2018-01-31 VITALS — BP 110/76 | HR 83 | Temp 98.3°F | Wt 188.8 lb

## 2018-01-31 DIAGNOSIS — I10 Essential (primary) hypertension: Secondary | ICD-10-CM | POA: Diagnosis not present

## 2018-01-31 LAB — BASIC METABOLIC PANEL
BUN: 11 mg/dL (ref 6–23)
CHLORIDE: 99 meq/L (ref 96–112)
CO2: 30 mEq/L (ref 19–32)
Calcium: 9.8 mg/dL (ref 8.4–10.5)
Creatinine, Ser: 0.82 mg/dL (ref 0.40–1.50)
GFR: 100.49 mL/min (ref >60.00)
Glucose, Bld: 108 mg/dL — ABNORMAL HIGH (ref 70–99)
POTASSIUM: 4.6 meq/L (ref 3.5–5.1)
SODIUM: 137 meq/L (ref 135–145)

## 2018-01-31 MED ORDER — LISINOPRIL 10 MG PO TABS: 10.0000 mg | ORAL_TABLET | Freq: Every day | ORAL | 3 refills | Status: AC

## 2018-01-31 NOTE — Patient Instructions (Signed)
Stop the Zestoretic.................Casey Kim begin plain lisinopril 10 mg daily.  Labs today.........Casey Kim we will call if there is anything abnormal

## 2018-01-31 NOTE — Progress Notes (Signed)
Casey Kim is a 62 year old male who comes in today for follow-up of hypertension  He has been on Zestoretic 10-12 0.5 for 3 weeks because of underlying new onset hypertension  No side effects to medication, except for nocturia every 2-3 hours.  BP 110/76 (BP Location: Right Arm, Patient Position: Sitting, Cuff Size: Normal)   Pulse 83   Temp 98.3 F (36.8 C) (Oral)   Wt 188 lb 12.8 oz (85.6 kg)   SpO2 97%   BMI 28.29 kg/m  Well-developed well-nourished male no acute distress vital signs stable he is afebrile BP normal as noted above  Impression hypertension at goal however side effect of nocturia therefore eliminate the diuretic portion and just go with plain lisinopril 10 mg daily.  Monitor blood pressure to be sure it stays normal.  Check renal function today

## 2018-02-07 ENCOUNTER — Ambulatory Visit: Payer: Managed Care, Other (non HMO) | Admitting: Sports Medicine

## 2018-06-29 ENCOUNTER — Encounter: Payer: Self-pay | Admitting: Family Medicine

## 2018-06-29 ENCOUNTER — Ambulatory Visit: Payer: 59 | Admitting: Family Medicine

## 2018-06-29 ENCOUNTER — Other Ambulatory Visit: Payer: Self-pay

## 2018-06-29 VITALS — BP 128/78 | HR 78 | Temp 98.3°F | Ht 68.5 in | Wt 194.8 lb

## 2018-06-29 DIAGNOSIS — J3089 Other allergic rhinitis: Secondary | ICD-10-CM | POA: Diagnosis not present

## 2018-06-29 DIAGNOSIS — H6982 Other specified disorders of Eustachian tube, left ear: Secondary | ICD-10-CM | POA: Diagnosis not present

## 2018-06-29 MED ORDER — AZELASTINE HCL 0.1 % NA SOLN: 2.0000 | Freq: Two times a day (BID) | NASAL | 12 refills | Status: AC

## 2018-06-29 NOTE — Patient Instructions (Signed)
Could also consider OTC anti-histamine such as Claritan, Zyrtec, Allegra, or Xyzal.

## 2018-06-29 NOTE — Progress Notes (Signed)
  Subjective:     Patient ID: Casey Kim, male   DOB: [birth year], 63 y.o.   MRN: [id number]  HPI Patient is seen with some left ear fullness for the past several days.  Has had frequent chronic postnasal drip symptoms and nasal congestion.  He thinks he has some year-round allergies.  He is taken Flonase in the past without much success.  He states he has constant postnasal drip.  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  He has hearing aids and has devices to try to drown out tinnitus but these have not been very effective.  Denies any ear pain.  No drainage.  Occasional vertigo symptoms which are usually very transient.  Past Medical History:  Diagnosis Date  . Allergic rhinitis   . Diverticulosis 1988  . Hearing loss   . Hyperlipidemia   . Restless leg syndrome    Past Surgical History:  Procedure Laterality Date  . COLONOSCOPY    . MENISCUS REPAIR Right   . PARTIAL COLECTOMY  1998   for severe diverticulitis   . Rhinoseptoplasty    . TONSILLECTOMY      reports that he has never smoked. He has quit using smokeless tobacco. He reports current alcohol use. He reports that he does not use drugs. family history includes Diabetes in his brother and father; Heart failure in his father; Lung cancer in his mother. Allergies  Allergen Reactions  . Fish Oil     REACTION: rash     Review of Systems  Constitutional: Negative for chills and fever.  HENT: Positive for congestion, postnasal drip and rhinorrhea. Negative for ear discharge, ear pain and sinus pain.        Objective:   Physical Exam Constitutional:      Appearance: Normal appearance.  HENT:     Right Ear: Tympanic membrane and ear canal normal. There is no impacted cerumen.     Left Ear: Tympanic membrane and ear canal normal. There is no impacted cerumen.     Mouth/Throat:     Pharynx: Oropharynx is clear. No oropharyngeal exudate.  Cardiovascular:     Rate and  Rhythm: Normal rate and regular rhythm.  Pulmonary:     Effort: Pulmonary effort is normal.     Breath sounds: Normal breath sounds.  Neurological:     Mental Status: He is alert.        Assessment:     #1 frequent postnasal drip symptoms.  Suspect perennial allergies  #2 probable left eustachian tube dysfunction.  He has chronic sensorineural hearing loss bilaterally.  No cerumen    Plan:     -Astelin nasal 1 to 2 sprays per nostril twice daily -May add over-the-counter antihistamine such as Claritin, Allegra, or Zyrtec -Touch base if postnasal drip symptoms not improving with the above  Eulas Post MD  Primary Care at [facility]

## 2018-07-18 ENCOUNTER — Ambulatory Visit (INDEPENDENT_AMBULATORY_CARE_PROVIDER_SITE_OTHER): Payer: 59 | Admitting: Physician Assistant

## 2018-07-18 ENCOUNTER — Other Ambulatory Visit: Payer: Self-pay

## 2018-07-18 ENCOUNTER — Encounter: Payer: Self-pay | Admitting: Physician Assistant

## 2018-07-18 VITALS — BP 118/70 | HR 90 | Temp 98.4°F | Ht 68.5 in | Wt 192.0 lb

## 2018-07-18 DIAGNOSIS — R059 Cough, unspecified: Secondary | ICD-10-CM

## 2018-07-18 DIAGNOSIS — R05 Cough: Secondary | ICD-10-CM

## 2018-07-18 MED ORDER — DOXYCYCLINE HYCLATE 100 MG PO TABS: 100.0000 mg | ORAL_TABLET | Freq: Two times a day (BID) | ORAL | 0 refills | Status: DC

## 2018-07-18 MED ORDER — FLUTICASONE PROPIONATE 50 MCG/ACT NA SUSP: 2.0000 | Freq: Every day | NASAL | 2 refills | Status: DC

## 2018-07-18 NOTE — Progress Notes (Signed)
Casey Kim is a 63 y.o. male here for a new problem.  I acted as a Education administrator for [employer], PA-C Anselmo Pickler, LPN  History of Present Illness:   Chief Complaint  Patient presents with  . Cough    Cough  This is a new problem. Episode onset: Started 3 weeks ago. The problem has been gradually improving. The cough is productive of sputum (expectorating clear sputum). Associated symptoms include ear congestion, ear pain (Right ear Friday night has subsided), nasal congestion and postnasal drip. Pertinent negatives include no chills, fever, headaches, sore throat or shortness of breath. Risk factors for lung disease include occupational exposure. Treatments tried: Nasal spray. The treatment provided mild relief.     Past Medical History:  Diagnosis Date  . Allergic rhinitis   . Diverticulosis 1988  . Hearing loss   . Hyperlipidemia   . Restless leg syndrome      Social History   Socioeconomic History  . Marital status: Married    Spouse name: Not on file  . Number of children: 2  . Years of education: Not on file  . Highest education level: Not on file  Occupational History  . 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  Social Needs  . Financial resource strain: Not on file  . Food insecurity:    Worry: Not on file    Inability: Not on file  . Transportation needs:    Medical: Not on file    Non-medical: Not on file  Tobacco Use  . Smoking status: Never Smoker  . Smokeless tobacco: Former Network engineer and Sexual Activity  . Alcohol use: Yes    Comment: 2-3 beers a day  . Drug use: No    Comment: marijuana weekly  . Sexual activity: Not on file  Lifestyle  . Physical activity:    Days per week: Not on file    Minutes per session: Not on file  . Stress: Not on file  Relationships  . Social connections:    Talks on phone: Not on file    Gets together: Not on file    Attends religious service: Not on file    Active member of club or  organization: Not on file    Attends meetings of clubs or organizations: Not on file    Relationship status: Not on file  . Intimate partner violence:    Fear of current or ex partner: Not on file    Emotionally abused: Not on file    Physically abused: Not on file    Forced sexual activity: Not on file  Other Topics Concern  . Not on file  Social History Narrative  . Not on file    Past Surgical History:  Procedure Laterality Date  . COLONOSCOPY    . MENISCUS REPAIR Right   . PARTIAL COLECTOMY  1998   for severe diverticulitis   . Rhinoseptoplasty    . TONSILLECTOMY      Family History  Problem Relation Age of Onset  . Diabetes Father   . Heart failure Father   . Lung cancer Mother   . Diabetes Brother     Allergies  Allergen Reactions  . Fish Oil     REACTION: rash    Current Medications:   Current Outpatient Medications:  .  acyclovir (ZOVIRAX) 400 MG tablet, Take 1 tablet (400 mg total) by mouth 3 (three) times daily., Disp: 30 tablet, Rfl: 4 .  aspirin 81  MG tablet, Take 81 mg by mouth daily., Disp: , Rfl:  .  atorvastatin (LIPITOR) 20 MG tablet, Take 1 tablet (20 mg total) by mouth daily., Disp: 90 tablet, Rfl: 3 .  azelastine (ASTELIN) 0.1 % nasal spray, Place 2 sprays into both nostrils 2 (two) times daily. Use in each nostril as directed, Disp: 30 mL, Rfl: 12 .  CANNABIDIOL PO, Take by mouth., Disp: , Rfl:  .  lisinopril (PRINIVIL,ZESTRIL) 10 MG tablet, Take 1 tablet (10 mg total) by mouth daily., Disp: 90 tablet, Rfl: 3 .  lisinopril-hydrochlorothiazide (PRINZIDE,ZESTORETIC) 10-12.5 MG tablet, Take 1 tablet by mouth daily., Disp: 90 tablet, Rfl: 3 .  omeprazole (PRILOSEC) 10 MG capsule, Take 1 capsule (10 mg total) by mouth daily., Disp: 100 capsule, Rfl: 4 .  OVER THE COUNTER MEDICATION, , Disp: , Rfl:  .  rOPINIRole (REQUIP) 4 MG tablet, Take 1 tablet (4 mg total) by mouth at bedtime., Disp: 100 tablet, Rfl: 4 .  doxycycline (VIBRA-TABS) 100 MG tablet,  Take 1 tablet (100 mg total) by mouth 2 (two) times daily., Disp: 20 tablet, Rfl: 0 .  fluticasone (FLONASE) 50 MCG/ACT nasal spray, Place 2 sprays into both nostrils daily., Disp: 16 g, Rfl: 2   Review of Systems:   Review of Systems  Constitutional: Negative for chills and fever.  HENT: Positive for ear pain (Right ear Friday night has subsided) and postnasal drip. Negative for sore throat.   Respiratory: Positive for cough. Negative for shortness of breath.   Neurological: Negative for headaches.    Vitals:   Vitals:   07/18/18 1305  BP: 118/70  Pulse: 90  Temp: 98.4 F (36.9 C)  TempSrc: Oral  SpO2: 97%  Weight: 192 lb (87.1 kg)  Height: 5' 8.5" (1.74 m)     Body mass index is 28.77 kg/m.  Physical Exam:   Physical Exam Vitals signs and nursing note reviewed.  Constitutional:      General: He is not in acute distress.    Appearance: He is well-developed. He is not ill-appearing or toxic-appearing.  HENT:     Head: Normocephalic and atraumatic.     Right Ear: Tympanic membrane, ear canal and external ear normal. Tympanic membrane is not erythematous, retracted or bulging.     Left Ear: Tympanic membrane, ear canal and external ear normal. Tympanic membrane is not erythematous, retracted or bulging.     Nose: Nose normal.     Right Sinus: No maxillary sinus tenderness or frontal sinus tenderness.     Left Sinus: No maxillary sinus tenderness or frontal sinus tenderness.     Mouth/Throat:     Lips: Pink.     Mouth: Mucous membranes are moist.     Pharynx: Uvula midline. Posterior oropharyngeal erythema present.     Tonsils: Swelling: 0 on the right. 0 on the left.  Eyes:     General: Lids are normal.     Conjunctiva/sclera: Conjunctivae normal.  Neck:     Trachea: Trachea normal.  Cardiovascular:     Rate and Rhythm: Normal rate and regular rhythm.     Heart sounds: Normal heart sounds, S1 normal and S2 normal.  Pulmonary:     Effort: Pulmonary effort is  normal.     Breath sounds: Normal breath sounds. No decreased breath sounds, wheezing, rhonchi or rales.  Lymphadenopathy:     Cervical: No cervical adenopathy.  Skin:    General: Skin is warm and dry.  Neurological:     Mental  Status: He is alert.  Psychiatric:        Speech: Speech normal.        Behavior: Behavior normal. Behavior is cooperative.      Assessment and Plan:   Drake was seen today for cough.  Diagnoses and all orders for this visit:  Cough No red flags on exam.  Will initiate doxycycline and flonase per orders. Discussed taking medications as prescribed. Reviewed return precautions including worsening fever, SOB, worsening cough or other concerns. Push fluids and rest. I recommend that patient follow-up if symptoms worsen or persist despite treatment x 7-10 days, sooner if needed.  Other orders -     fluticasone (FLONASE) 50 MCG/ACT nasal spray; Place 2 sprays into both nostrils daily. -     doxycycline (VIBRA-TABS) 100 MG tablet; Take 1 tablet (100 mg total) by mouth 2 (two) times daily.    . Reviewed expectations re: course of current medical issues. . Discussed self-management of symptoms. . Outlined signs and symptoms indicating need for more acute intervention. . Patient verbalized understanding and all questions were answered. . See orders for this visit as documented in the electronic medical record. . Patient received an After-Visit Summary.  CMA or LPN served as scribe during this visit. History, Physical, and Plan performed by medical provider. The above documentation has been reviewed and is accurate and complete.  Inda Coke, PA-C

## 2018-07-18 NOTE — Patient Instructions (Signed)
It was great to see you!  Start flonase and doxycycline antibiotic.  Push fluids and get plenty of rest.  Please return if you are not improving as expected, or if you have high fevers (>101.5) or difficulty swallowing or worsening productive cough.  Call clinic with questions.  I hope you start feeling better soon!

## 2018-07-25 ENCOUNTER — Ambulatory Visit (INDEPENDENT_AMBULATORY_CARE_PROVIDER_SITE_OTHER): Payer: 59 | Admitting: Sports Medicine

## 2018-07-25 ENCOUNTER — Encounter: Payer: Self-pay | Admitting: Sports Medicine

## 2018-07-25 DIAGNOSIS — H6982 Other specified disorders of Eustachian tube, left ear: Secondary | ICD-10-CM

## 2018-07-25 DIAGNOSIS — M25559 Pain in unspecified hip: Secondary | ICD-10-CM

## 2018-07-25 DIAGNOSIS — M25551 Pain in right hip: Secondary | ICD-10-CM

## 2018-07-25 DIAGNOSIS — J3089 Other allergic rhinitis: Secondary | ICD-10-CM | POA: Diagnosis not present

## 2018-07-25 DIAGNOSIS — H6992 Unspecified Eustachian tube disorder, left ear: Secondary | ICD-10-CM

## 2018-07-25 MED ORDER — CELECOXIB 100 MG PO CAPS: 100.0000 mg | ORAL_CAPSULE | Freq: Two times a day (BID) | ORAL | 2 refills | Status: AC | PRN

## 2018-07-25 NOTE — Progress Notes (Signed)
Casey Kim. Casey Kim, [facility] [place]  Casey Kim - 63 y.o. male MRN [id number]  Date of birth: May 24, 1955  Visit Date: July 25, 2018  PCP: Dorena Cookey, MD   Referred by: Dorena Cookey, MD   Virtual Visit via Video (WebEx)  I connected with Casey Kim on 07/25/18 at  2:20 PM EDT by a video enabled telemedicine application and verified that I am speaking with the correct person using two identifiers. Location patient: Home Location provider: Provider office Persons participating in the virtual visit: Patient only Face to Face time with the patient: 13 min  I discussed the limitations of evaluation and management by telemedicine and the availability of in person appointments. The patient expressed understanding and agreed to proceed.   SUBJECTIVE:  Chief Complaint: Right posterior hip pain  HPI: Patient presents with multiple issues for his WebEx visit.  His biggest complaint is right posterior buttock pain.  This is worse towards the end of the day and with activity.  He reports slipping on hardwood floors and landing directly on his buttocks several months ago and has had intermittent symptoms since that time.  He tried the Lobbyist with only minimal improvement.  Ultimately he did stop doing these due to lack of efficacy.  His symptoms have gradually worsened over the past several weeks.  He has not tried any medications on a regular basis.  REVIEW OF SYSTEMS: No significant nighttime awakenings due to this issue. Denies fevers, chills, recent weight gain or weight loss.  No night sweats.  Pt denies any change in bowel or bladder habits, muscle weakness, numbness or falls associated with this pain.  HISTORY:  Prior history reviewed and updated per electronic medical record.  Patient Active Problem List   Diagnosis Date Noted  . Dysfunction of left eustachian tube 07/25/2018  . Greater  trochanteric pain syndrome 07/25/2018  . Essential hypertension 01/31/2018  . Routine general medical examination at a health care facility 07/29/2015  . ED (erectile dysfunction) 09/22/2011  . Hyperlipidemia 04/26/2008    Qualifier: Diagnosis of  By: Sherren Mocha MD, Jory Ee    . RESTLESS LEG SYNDROME 12/13/2006    Qualifier: Diagnosis of  By: Sherren Mocha MD, Jory Ee    . RHINITIS, ALLERGIC, DUE TO POLLEN 12/13/2006    Qualifier: Diagnosis of  By: Sherren Mocha MD, Jory Ee     Social History   Occupational History  . 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  Tobacco Use  . Smoking status: Never Smoker  . Smokeless tobacco: Former Network engineer and Sexual Activity  . Alcohol use: Yes    Comment: 2-3 beers a day  . Drug use: No    Comment: marijuana weekly  . Sexual activity: Not on file   Social History   Social History Narrative  . Not on file    OBJECTIVE:  VS:  HT:    WT:   BMI:     BP:   HR: bpm  TEMP: ( )  RESP:    PHYSICAL EXAM: Adult male. No acute distress.  Alert and appropriate. Patient is able to stand up and walk without difficulty.  He has pain localizing to the right gluteal muscles.   ASSESSMENT:   1. Right hip pain   2. Perennial allergic rhinitis   3. Dysfunction of left eustachian tube   4. Greater trochanteric pain syndrome  PROCEDURES:  PROCEDURE NOTE: THERAPEUTIC EXERCISES - This was not billed Discussed the foundation of treatment for this condition is physical therapy and/or daily (5-6 days/week) therapeutic exercises, focusing on core strengthening, coordination, neuromuscular control/reeducation. 15 minutes spent for Therapeutic exercises as below and as referenced in the AVS. This included exercises focusing on stretching, strengthening, with significant focus on eccentric aspects.  Proper technique shown and discussed handout in great detail with ATC. All questions were discussed and answered.  Long term goals include  an improvement in range of motion, strength, endurance as well as avoiding reinjury. Frequency of visits is one time as determined during today's office visit. Frequency of exercises to be performed is as per handout. EXERCISES REVIEWED: Hip ABduction strengthening with focus on Glute Medius Recruitment IT Band Stretching Piriformis stretch     PLAN:  Pertinent additional documentation may be included in corresponding procedure notes, imaging studies, problem based documentation and patient instructions.  Greater trochanteric pain syndrome Symptoms are consistent with greater trochanteric pain syndrome.  He should benefit from the therapeutic exercises that were provided.  Anti-inflammatories with Celebrex and into his pharmacy for 2-week trial then intermittent as needed.  If any lack of improvement further diagnostic evaluation with MSK ultrasound of the gluteal muscles can be considered.  We will try to minimize contact from COVID-19 crisis.  Additionally corticosteroids by mouth could be considered but once again try to minimize the use of these at this time.  Dysfunction of left eustachian tube Chronic ongoing issues with the left ear.  He should actually benefit from the Celebrex for this issue as well.  If any persistent ongoing symptoms will need follow-up with ENT versus PCP.   Home Therapeutic Exercises: New program prescribed today per procedure note.  Ideally nonsteroidal anti-inflammatories (NSAIDs) should not be taken on a chronic daily basis.  Appropriate use of these medications involves "bursting" full prescription strength dosing of the medication over a relatively short amount of time.  Ideally taking full doses of anti-inflammatories as discussed for 3 to 5 days at a time then stopping all NSAID use for as long as possible is the ideal way to reduce the risk of kidney/liver injury as well as GI bleeding.  Bursting a medication for up to 2 weeks to initiate then as needed for  3-5 days at a time (1-2 days after symptoms improve) can help minimize this risk.  This was discussed in detail with the patient today as well as appropriate GI prophylaxis with a PPI/H2 blocker.  Activity modifications and the importance of avoiding exacerbating activities (limiting pain to no more than a 4 / 10 during or following activity) recommended and discussed.   Discussed red flag symptoms that warrant earlier emergent evaluation and patient voices understanding.    Meds ordered this encounter  Medications  . celecoxib (CELEBREX) 100 MG capsule    Sig: Take 1 capsule (100 mg total) by mouth 2 (two) times daily as needed.    Dispense:  60 capsule    Refill:  2   Lab Orders  No laboratory test(s) ordered today   Imaging Orders  No imaging studies ordered today   Referral Orders  No referral(s) requested today    Return in about 4 weeks (around 08/22/2018), or if symptoms worsen or fail to improve.          Gerda Diss, [facility] Physician

## 2018-07-25 NOTE — Assessment & Plan Note (Signed)
Chronic ongoing issues with the left ear.  He should actually benefit from the Celebrex for this issue as well.  If any persistent ongoing symptoms will need follow-up with ENT versus PCP.

## 2018-07-25 NOTE — Patient Instructions (Addendum)
  Please perform the exercise program that we have prepared for you and gone over in detail on a daily basis.  In addition to the handout you were provided you can access your program through: [email or web address]   Your unique program code is:  [email or web address]

## 2018-07-25 NOTE — Assessment & Plan Note (Addendum)
Symptoms are consistent with greater trochanteric pain syndrome.  He should benefit from the therapeutic exercises that were provided.  Anti-inflammatories with Celebrex and into his pharmacy for 2-week trial then intermittent as needed.  If any lack of improvement further diagnostic evaluation with MSK ultrasound of the gluteal muscles can be considered.  We will try to minimize contact from COVID-19 crisis.  Additionally corticosteroids by mouth could be considered but once again try to minimize the use of these at this time.

## 2018-10-09 ENCOUNTER — Other Ambulatory Visit: Payer: Self-pay | Admitting: Physician Assistant

## 2018-10-13 ENCOUNTER — Ambulatory Visit (INDEPENDENT_AMBULATORY_CARE_PROVIDER_SITE_OTHER): Payer: 59

## 2018-10-13 ENCOUNTER — Ambulatory Visit: Payer: 59 | Admitting: Podiatry

## 2018-10-13 ENCOUNTER — Other Ambulatory Visit: Payer: Self-pay

## 2018-10-13 ENCOUNTER — Encounter: Payer: Self-pay | Admitting: Podiatry

## 2018-10-13 VITALS — BP 131/89 | HR 85 | Temp 98.0°F | Resp 16

## 2018-10-13 DIAGNOSIS — M722 Plantar fascial fibromatosis: Secondary | ICD-10-CM

## 2018-10-13 MED ORDER — METHYLPREDNISOLONE 4 MG PO TBPK: ORAL_TABLET | ORAL | 0 refills | Status: DC

## 2018-10-13 MED ORDER — MELOXICAM 15 MG PO TABS: 15.0000 mg | ORAL_TABLET | Freq: Every day | ORAL | 3 refills | Status: DC

## 2018-10-13 NOTE — Patient Instructions (Signed)

## 2018-10-13 NOTE — Progress Notes (Signed)
Subjective:  Patient ID: Casey Kim, male    DOB: 1956/03/27,  MRN: [id number] HPI Chief Complaint  Patient presents with  . Foot Pain    Left foot; bottom of heel & arch; pt stated, "I have constant pain; hurts more in the morning and when I have been sitting for awhile"; x2 months    63 y.o. male presents with the above complaint.   ROS: Denies fever chills nausea vomiting muscle aches pains calf pain back pain chest pain shortness of breath.  Past Medical History:  Diagnosis Date  . Allergic rhinitis   . Diverticulosis 1988  . Hearing loss   . Hyperlipidemia   . Restless leg syndrome    Past Surgical History:  Procedure Laterality Date  . COLONOSCOPY    . MENISCUS REPAIR Right   . PARTIAL COLECTOMY  1998   for severe diverticulitis   . Rhinoseptoplasty    . TONSILLECTOMY      Current Outpatient Medications:  .  acyclovir (ZOVIRAX) 400 MG tablet, Take 1 tablet (400 mg total) by mouth 3 (three) times daily., Disp: 30 tablet, Rfl: 4 .  aspirin 81 MG tablet, Take 81 mg by mouth daily., Disp: , Rfl:  .  atorvastatin (LIPITOR) 20 MG tablet, Take 1 tablet (20 mg total) by mouth daily., Disp: 90 tablet, Rfl: 3 .  azelastine (ASTELIN) 0.1 % nasal spray, Place 2 sprays into both nostrils 2 (two) times daily. Use in each nostril as directed, Disp: 30 mL, Rfl: 12 .  celecoxib (CELEBREX) 100 MG capsule, Take 1 capsule (100 mg total) by mouth 2 (two) times daily as needed., Disp: 60 capsule, Rfl: 2 .  fluticasone (FLONASE) 50 MCG/ACT nasal spray, SPRAY 2 SPRAYS INTO EACH NOSTRIL EVERY DAY, Disp: 48 g, Rfl: 0 .  lisinopril (PRINIVIL,ZESTRIL) 10 MG tablet, Take 1 tablet (10 mg total) by mouth daily., Disp: 90 tablet, Rfl: 3 .  omeprazole (PRILOSEC) 10 MG capsule, Take 1 capsule (10 mg total) by mouth daily., Disp: 100 capsule, Rfl: 4 .  OVER THE COUNTER MEDICATION, , Disp: , Rfl:  .  rOPINIRole (REQUIP) 4 MG tablet, Take 1 tablet (4 mg total) by mouth at bedtime., Disp: 100 tablet,  Rfl: 4 .  meloxicam (MOBIC) 15 MG tablet, Take 1 tablet (15 mg total) by mouth daily., Disp: 30 tablet, Rfl: 3 .  methylPREDNISolone (MEDROL DOSEPAK) 4 MG TBPK tablet, 6 day dose pack - take as directed, Disp: 21 tablet, Rfl: 0  Allergies  Allergen Reactions  . Fish Oil     REACTION: rash   Review of Systems Objective:   Vitals:   10/13/18 1002  BP: 131/89  Pulse: 85  Resp: 16  Temp: 98 F (36.7 C)    General: Well developed, nourished, in no acute distress, alert and oriented x3   Dermatological: Skin is warm, dry and supple bilateral. Nails x 10 are well maintained; remaining integument appears unremarkable at this time. There are no open sores, no preulcerative lesions, no rash or signs of infection present.  Vascular: Dorsalis Pedis artery and Posterior Tibial artery pedal pulses are 2/4 bilateral with immedate capillary fill time. Pedal hair growth present. No varicosities and no lower extremity edema present bilateral.   Neruologic: Grossly intact via light touch bilateral. Vibratory intact via tuning fork bilateral. Protective threshold with Semmes Wienstein monofilament intact to all pedal sites bilateral. Patellar and Achilles deep tendon reflexes 2+ bilateral. No Babinski or clonus noted bilateral.   Musculoskeletal: No gross boney  pedal deformities bilateral. No pain, crepitus, or limitation noted with foot and ankle range of motion bilateral. Muscular strength 5/5 in all groups tested bilateral.  Has pain on palpation medial calcaneal tubercle left  Gait: Unassisted, Nonantalgic.    Radiographs:  Radiographs taken today demonstrate soft tissue increase in density plantar fascial calcaneal insertion site of the heel.  Assessment & Plan:   Assessment: Plantar fasciitis left.  Plan: Discussed etiology pathology conservative or surgical therapies.  At this point I injected 20 mg of Kenalog 5 mg Marcaine for maximal tenderness left heel.  Placed him in a plantar  fascial night splint.  Start him on a Medrol Dosepak to be followed by meloxicam discussed appropriate shoe gear stretching exercise and ice therapy.      T. [place], [place]

## 2018-11-17 ENCOUNTER — Other Ambulatory Visit: Payer: Self-pay

## 2018-11-17 ENCOUNTER — Encounter: Payer: Self-pay | Admitting: Podiatry

## 2018-11-17 ENCOUNTER — Ambulatory Visit (INDEPENDENT_AMBULATORY_CARE_PROVIDER_SITE_OTHER): Payer: 59 | Admitting: Podiatry

## 2018-11-17 VITALS — Temp 97.7°F

## 2018-11-17 DIAGNOSIS — M722 Plantar fascial fibromatosis: Secondary | ICD-10-CM

## 2018-11-19 NOTE — Progress Notes (Signed)
He presents today for follow-up of his left heel pain.  He states that as of right now he really does not have any pain left.  Seems to have resolved nearly 100%.  He is very happy with the outcome thus far.  Objective: Vital signs are stable he is alert and oriented x3.  There is no erythema edema cellulitis drainage odor he has no pain on palpation medial calcaneal tubercle of the left heel.  Assessment: Well-healing plantar fasciitis left.  Plan: I encouraged him to continue his meloxicam plantar fascial brace night splint and appropriate shoe gear we may need to consider orthotics with reoccurrence.

## 2019-01-26 ENCOUNTER — Encounter: Payer: Self-pay | Admitting: Podiatry

## 2019-03-17 ENCOUNTER — Other Ambulatory Visit: Payer: Self-pay | Admitting: Family Medicine

## 2019-03-17 NOTE — Telephone Encounter (Signed)
Medication Refill - Medication:  rOPINIRole (REQUIP) 4 MG tablet   Has the patient contacted their pharmacy? Yes advised to call office. Pt is wanting this tonight.   Preferred Pharmacy (with phone number or street name):  [facility]  [address], [place], [place] [address]  Agent: Please be advised that RX refills may take up to 3 business days. We ask that you follow-up with your pharmacy.

## 2019-03-17 NOTE — Telephone Encounter (Signed)
Requested medication (s) are due for refill today: yes  Requested medication (s) are on the active medication list:yes  Last refill:  01/15/2018  Future visit scheduled: no  Notes to clinic:  Last filled by different provider Review for refill   Requested Prescriptions  Pending Prescriptions Disp Refills   rOPINIRole (REQUIP) 4 MG tablet 100 tablet 4    Sig: Take 1 tablet (4 mg total) by mouth at bedtime.     Neurology:  Parkinsonian Agents Passed - 03/17/2019 11:36 AM      Passed - Last BP in normal range    BP Readings from Last 1 Encounters:  10/13/18 131/89         Passed - Valid encounter within last 12 months    Recent Outpatient Visits          7 months ago Right hip pain   [place] [facility], [place] D, DO   8 months ago Cough   [facility] [place], [place], [place]   8 months ago Perennial allergic rhinitis   [facility], Alinda Sierras, MD   1 year ago Essential hypertension   [facility] at [place], [place]   1 year ago Subconjunctival hemorrhage, traumatic, right   [facility] at [facility], Alinda Sierras, MD

## 2019-03-17 NOTE — Telephone Encounter (Signed)
Patient calling to check status of refill. Advised of refill policy. States that he is needing to prescription to go to pharmacy listed in chart and below in this message. Original pharmacy in previous message is incorrect.  [id number] [place], [place] - [place]

## 2019-03-17 NOTE — Telephone Encounter (Signed)
Please refill request 

## 2019-03-18 NOTE — Telephone Encounter (Signed)
See patient Rx refill request

## 2019-03-20 NOTE — Telephone Encounter (Signed)
Patient called in stating he is really needing this medication. Please advise.

## 2019-03-22 ENCOUNTER — Other Ambulatory Visit: Payer: Self-pay

## 2019-03-22 MED ORDER — ROPINIROLE HCL 4 MG PO TABS: 4.0000 mg | ORAL_TABLET | Freq: Every day | ORAL | 0 refills | Status: DC

## 2019-04-05 ENCOUNTER — Other Ambulatory Visit: Payer: Self-pay | Admitting: Family Medicine

## 2019-04-05 MED ORDER — ROPINIROLE HCL 4 MG PO TABS: 4.0000 mg | ORAL_TABLET | Freq: Every day | ORAL | 0 refills | Status: DC

## 2019-04-05 NOTE — Telephone Encounter (Signed)
Medication Refill - Medication: rOPINIRole (REQUIP) 4 MG tablet  Has the patient contacted their pharmacy? Yes.   (Agent: If no, request that the patient contact the pharmacy for the refill.) (Agent: If yes, when and what did the pharmacy advise?)  Preferred Pharmacy (with phone number or street name[id number] [place], [place]: Please be advised that RX refills may take up to 3 business days. We ask that you follow-up with your pharmacy.  Patient wont be able to see a physician until 05/22/19 and he wants to know if pcp would send him in another 30 Rx to pharmacy to last until his appt.

## 2019-04-05 NOTE — Telephone Encounter (Signed)
Requested medication (s) are due for refill today: yes  Requested medication (s) are on the active medication list: yes  Last refill:  03/22/2019  Future visit scheduled: no  Notes to clinic:  Patient wont be able to see a physician until 05/22/19 and he wants to know if pcp would send him in another 30 Rx to pharmacy to last until his appt   Requested Prescriptions  Pending Prescriptions Disp Refills   rOPINIRole (REQUIP) 4 MG tablet 30 tablet 0    Sig: Take 1 tablet (4 mg total) by mouth at bedtime.     Neurology:  Parkinsonian Agents Passed - 04/05/2019 10:24 AM      Passed - Last BP in normal range    BP Readings from Last 1 Encounters:  10/13/18 131/89         Passed - Valid encounter within last 12 months    Recent Outpatient Visits          8 months ago Right hip pain   [place], [place], DO   8 months ago Cough   [facility] [place], [place], [place]   9 months ago Perennial allergic rhinitis   [facility], Alinda Sierras, MD   1 year ago Essential hypertension   [facility] at [place], [place]   1 year ago Subconjunctival hemorrhage, traumatic, right   [facility], Alinda Sierras, MD

## 2019-04-05 NOTE — Telephone Encounter (Signed)
See request °

## 2019-04-13 ENCOUNTER — Other Ambulatory Visit: Payer: Self-pay | Admitting: Family Medicine

## 2019-04-20 ENCOUNTER — Other Ambulatory Visit: Payer: Self-pay | Admitting: Podiatry

## 2019-05-09 ENCOUNTER — Other Ambulatory Visit: Payer: Self-pay | Admitting: Family Medicine

## 2019-05-18 ENCOUNTER — Other Ambulatory Visit: Payer: Self-pay | Admitting: Family Medicine

## 2023-11-26 NOTE — Progress Notes (Addendum)
 Audiogram: 11/19/23 -    Imaging : 06/22/2019 - MRI [facility] w wo No evidence of retrocochlear pathology   Device Selection : (L) [employer]  Pre-auth / Insurance Approval:  12/29/23(Breanna) 12/29/23 [place]) Auth approved for codes O1385,30069 for dates 2023/12/17-11/20/25. Spoke with Oneil DASEN ref# [id number]. Spoke with patent and estimate sent through MyChart.  Hearing Preservation Protocol (HF Only): Yes  Immunizations: Immunization History  Administered Date(s) Administered  . COVID-19 Pfizer Monovalent Vaccine 11/28/2020  . PNEUMOCOCCAL (PPSV23)(>=55YRS -OR- >=2 YRS WITH RISK) VACCINE (PNEUMOVAX 23) 10/28/2020  . Pneumococcal (PCV20) (>=6WKS) vaccine (Prevnar 20) (aka PCV 20) 03/09/2023      There is no problem list on file for this patient.

## 2023-11-29 NOTE — Progress Notes (Signed)
 Casey Kim October 16, 1955 male [id number]  Chief Complaint  Patient presents with  . Follow-up    4 month follow up after Lumbar XR/CT (Casey Kim- Reports in [facility])     HPI:  [id number]  OLIF procedure done on 05/2023. Denies any back or leg pain, weakness or numbness. Continues with L ankle swelling. Dr. Norine r/o any vascular issues. Rates his pain 0/10.   Current Medications[1]  Past Medical History:  Diagnosis Date  . Arthritis   . GERD (gastroesophageal reflux disease)   . Hemorrhoids   . Hypertension   . Nonsustained ventricular tachycardia (*)    Past Surgical History:  Procedure Laterality Date  . Colon surgery  1998  . Colonoscopy  05/17/2020   Casey NOVAK, MD  . L4/5 olif  06/02/2023   posterior lateral fusion; hardware  . Rotator cuff repair Right 05/2021   Dr Casey Kim   Family History  Problem Relation Age of Onset  . Cancer Mother   . Diabetes Father   . Hearing loss Father   . Heart disease Father   . Diabetes Brother    Social History[2]   Review of Systems  Constitutional: Negative.   HENT: Negative.   Eyes: Negative.   Respiratory: Negative.   Cardiovascular: Negative.   Gastrointestinal: Negative.   Genitourinary: Negative.   Musculoskeletal: Negative.   Skin: Negative.   Neurological: Negative.   Endo/Heme/Allergies: Negative.   Psychiatric/Behavioral: Negative.   Physical Exam Constitutional:      Appearance: Normal appearance.  HENT:     Head: Normocephalic.   Eyes:     Extraocular Movements: Extraocular movements intact.     Pupils: Pupils are equal, round, and reactive to light.    Cardiovascular:     Rate and Rhythm: Normal rate.   Musculoskeletal:        General: Normal range of motion.     Cervical back: Normal range of motion.  Pulmonary:     Effort: Pulmonary effort is normal.     Breath sounds: Normal breath sounds.  Neurological:     General: No focal deficit present.     Mental Status: He is alert and  oriented to person, place, and time. Mental status is at baseline.     Cranial Nerves: Cranial nerves are intact.     Sensory: Sensation is intact.     Motor: Motor function is intact.     Coordination: Coordination is intact.     Gait: Gait is intact.     Deep Tendon Reflexes:     Reflex Scores:      Tricep reflexes are 2+ on the right side and 2+ on the left side.      Bicep reflexes are 2+ on the right side and 2+ on the left side.      Brachioradialis reflexes are 2+ on the right side and 2+ on the left side.      Patellar reflexes are 0 on the right side and 1+ on the left side.      Achilles reflexes are 2+ on the right side and 2+ on the left side.  Radiology: Casey Kim:  CT lumbar done on 11/2023 show T12 old compression fracture. No hardware abnormality..  X ray lumbar done on 11/2023 show no movement on flex and ext.  Assessment/Plan: 1. Status post lumbar spinal fusion  CT Spine Lumbar WO Contrast   XR Spine  Lumbar 2-3 Views      Discussed X rays and CT lumbar. He  is doing well. Will see him back on his yr anniversary with CT lumbar and Xrays.   Casey LITTIE Embs, NP 7/28/202510:06 AM           [1] Current Outpatient Medications  Medication Sig Dispense Refill  . acyclovir  (ZOVIRAX ) 400 mg tablet Take one tablet (400 mg dose) by mouth 2 (two) times a day as needed (Fever Blisters). 30 tablet 5  . Aspirin 81 MG CAPS     . atorvastatin  (LIPITOR) 40 mg tablet TAKE 1 TABLET(40 MG) BY MOUTH DAILY 90 tablet 2  . beclomethasone HFA (QVAR) 40 mcg/actuation inhaler Inhale one puff into the lungs 2 (two) times daily. 1 each 1  . buPROPion HCl (WELLBUTRIN XL) 150 mg 24 hr tablet TAKE 1 TABLET BY MOUTH EVERY MORNING TO HELP REDUCE ALCOHOL CONSUMPTION 90 tablet 3  . fluticasone  propionate (FLONASE ) 50 mcg/actuation nasal spray SHAKE LIQUID AND USE 2 SPRAYS IN EACH NOSTRIL DAILY 48 g 0  . ipratropium (ATROVENT) 0.06% nasal spray USE 2 SPRAYS IN EACH NOSTRIL THREE TIMES DAILY  BEFORE MEALS    . losartan potassium (COZAAR) 25 mg tablet TAKE 1 TABLET(25 MG) BY MOUTH DAILY 90 tablet 3  . metoprolol succinate (TOPROL XL) 50 mg 24 hr tablet Take one tablet (50 mg dose) by mouth daily. 90 tablet 3  . pantoprazole sodium (PROTONIX) 40 mg tablet Take one tablet (40 mg dose) by mouth daily. 90 tablet 3  . ropinirole  (REQUIP ) 4 MG tablet TAKE 1 TABLET(4 MG) BY MOUTH EVERY NIGHT 90 tablet 3  . triamcinolone  acetonide (KENALOG ) 0.1% cream SMARTSIG:1 Application Topical 2-3 Times Daily     No current facility-administered medications for this visit.  [2] Social History Socioeconomic History  . Marital status: Married  Tobacco Use  . Smoking status: Never    Passive exposure: Past  . Smokeless tobacco: Former  Advertising account planner  . Vaping status: Never Used  Substance and Sexual Activity  . Alcohol use: Yes    Alcohol/week: 10.0 standard drinks of alcohol  . Drug use: Not Currently    Types: Marijuana  . Sexual activity: Yes    Partners: Female    Birth control/protection: Post-menopausal, None    Comment: married - Casey Kim  Social History Narrative   Married - Casey Kim  2 daughters  working  Pt dispenses his medications into a weekly pill box AM/PM 08/2020

## 2024-01-24 ENCOUNTER — Encounter: Payer: Self-pay | Admitting: Internal Medicine

## 2024-04-12 ENCOUNTER — Telehealth: Payer: Self-pay

## 2024-04-12 NOTE — Telephone Encounter (Signed)
 RN called patient to schedule recall colonoscopy, no answer.  Voicemail left to call [facility] at earliest convenience to schedule colonoscopy.

## 2024-04-13 ENCOUNTER — Telehealth: Payer: Self-pay

## 2024-04-13 NOTE — Telephone Encounter (Signed)
 Please see prior TE, duplicate in error.

## 2024-04-13 NOTE — Telephone Encounter (Signed)
 Inbound call from patient stating he no longer lives in [place] and [place] be scheduling procedure Please advise.

## 2024-04-13 NOTE — Telephone Encounter (Signed)
 RN spoke to patient and updated new mailing address.   Reminded patient to still get a colonoscopy in new area of residency. Pt states he just had a colonoscopy and is good for another 10 years.  Address updated. Will take off recall list.
